# Patient Record
Sex: Female | Born: 1935 | Race: White | Hispanic: No | Marital: Married | Smoking: Former smoker
Health system: Southern US, Community
[De-identification: ages and names within clinical notes are randomized; demographics above are authoritative.]

## PROBLEM LIST (undated history)

## (undated) DIAGNOSIS — I639 Cerebral infarction, unspecified: Secondary | ICD-10-CM

## (undated) DIAGNOSIS — I499 Cardiac arrhythmia, unspecified: Secondary | ICD-10-CM

## (undated) DIAGNOSIS — M1990 Unspecified osteoarthritis, unspecified site: Secondary | ICD-10-CM

## (undated) DIAGNOSIS — I719 Aortic aneurysm of unspecified site, without rupture: Secondary | ICD-10-CM

## (undated) DIAGNOSIS — Z8619 Personal history of other infectious and parasitic diseases: Secondary | ICD-10-CM

## (undated) DIAGNOSIS — J45909 Unspecified asthma, uncomplicated: Secondary | ICD-10-CM

## (undated) DIAGNOSIS — H3530 Unspecified macular degeneration: Secondary | ICD-10-CM

## (undated) DIAGNOSIS — Z9889 Other specified postprocedural states: Secondary | ICD-10-CM

## (undated) DIAGNOSIS — Z9981 Dependence on supplemental oxygen: Secondary | ICD-10-CM

## (undated) DIAGNOSIS — I10 Essential (primary) hypertension: Secondary | ICD-10-CM

## (undated) DIAGNOSIS — R112 Nausea with vomiting, unspecified: Secondary | ICD-10-CM

## (undated) DIAGNOSIS — R0602 Shortness of breath: Secondary | ICD-10-CM

## (undated) DIAGNOSIS — T8859XA Other complications of anesthesia, initial encounter: Secondary | ICD-10-CM

## (undated) DIAGNOSIS — C801 Malignant (primary) neoplasm, unspecified: Secondary | ICD-10-CM

## (undated) DIAGNOSIS — F32A Depression, unspecified: Secondary | ICD-10-CM

## (undated) DIAGNOSIS — J449 Chronic obstructive pulmonary disease, unspecified: Secondary | ICD-10-CM

## (undated) DIAGNOSIS — I4891 Unspecified atrial fibrillation: Secondary | ICD-10-CM

## (undated) DIAGNOSIS — T4145XA Adverse effect of unspecified anesthetic, initial encounter: Secondary | ICD-10-CM

## (undated) DIAGNOSIS — F329 Major depressive disorder, single episode, unspecified: Secondary | ICD-10-CM

## (undated) DIAGNOSIS — R110 Nausea: Secondary | ICD-10-CM

## (undated) HISTORY — PX: CHOLECYSTECTOMY: SHX55

## (undated) HISTORY — PX: ABDOMINAL HYSTERECTOMY: SHX81

## (undated) HISTORY — PX: EYE SURGERY: SHX253

---

## 2009-12-31 ENCOUNTER — Ambulatory Visit: Payer: Self-pay | Admitting: Cardiothoracic Surgery

## 2010-01-03 ENCOUNTER — Ambulatory Visit: Payer: Self-pay | Admitting: Cardiothoracic Surgery

## 2010-03-31 ENCOUNTER — Ambulatory Visit: Payer: Self-pay | Admitting: Cardiothoracic Surgery

## 2010-07-07 ENCOUNTER — Encounter: Admission: RE | Admit: 2010-07-07 | Discharge: 2010-07-07 | Payer: Self-pay | Admitting: Cardiothoracic Surgery

## 2010-07-07 ENCOUNTER — Ambulatory Visit: Payer: Self-pay | Admitting: Cardiothoracic Surgery

## 2010-12-09 ENCOUNTER — Other Ambulatory Visit: Payer: Self-pay | Admitting: Cardiothoracic Surgery

## 2010-12-09 DIAGNOSIS — I7101 Dissection of thoracic aorta: Secondary | ICD-10-CM

## 2011-01-12 ENCOUNTER — Ambulatory Visit
Admission: RE | Admit: 2011-01-12 | Discharge: 2011-01-12 | Disposition: A | Discharge status: Home / Self Care | Payer: Medicare Other | Source: Ambulatory Visit | Attending: Cardiothoracic Surgery | Admitting: Cardiothoracic Surgery

## 2011-01-12 ENCOUNTER — Ambulatory Visit (INDEPENDENT_AMBULATORY_CARE_PROVIDER_SITE_OTHER): Payer: Medicare Other | Admitting: Cardiothoracic Surgery

## 2011-01-12 DIAGNOSIS — I712 Thoracic aortic aneurysm, without rupture: Secondary | ICD-10-CM

## 2011-01-12 DIAGNOSIS — I7101 Dissection of thoracic aorta: Secondary | ICD-10-CM

## 2011-01-12 MED ORDER — IOHEXOL 300 MG/ML  SOLN
100.0000 mL | Freq: Once | INTRAMUSCULAR | Status: AC | PRN
  Administered 2011-01-12: 100 mL via INTRAVENOUS

## 2011-01-13 NOTE — Assessment & Plan Note (Signed)
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  This was in approximately 1 year ago.  Since that time, we have seen her in May, July and November 2011.  She returns at this point with a 38-month follow up of a CTA.  Overall, she feels well and notes that she has had better control of her blood pressure.  Recently, has had no chest or abdominal pain.  This originally was picked up on CT of the abdomen for a right pelvic pain.  She has had no recurrence of this discomfort.  On exam, her blood pressure 132/71, pulse 94, respiratory rate 20 and O2 sats 95%.  Her lungs are clear bilaterally.  She has full and equal pulses bilaterally.  No evidence of embolic phenomenon in the distal extremities.  She has no calf tenderness.  CTA of the chest was performed today and shows a penetrating ulcer in the mid thoracic aorta and also a second area further distal. These areas are about 26 x 23 mm and 11 x 21 mm.  Comparing the scans, there is no difference between the scan done 6 months ago.  I again discussed with her the treatment options of observation and good blood pressure control versus stent grafting of the thoracic aorta.  At this point, she is willing to continue with the current treatment regimen.  I will plan to see her back in 1 year with a CTA of the chest.  Sheliah Plane, MD Electronically Signed  EG/MEDQ  D:  01/12/2011  T:  01/13/2011  Job:  [id number]

## 2011-01-17 NOTE — Assessment & Plan Note (Signed)
[facility] VISIT   Karen Adams, Karen Adams  DOB:  [birth year]                                        Jan 03, 2010  CHART #:  [id number]   HISTORY OF PRESENT ILLNESS:  Karen Adams was seen in the office 3 days ago  after the incidental finding of an aortic ulceration in the distal  descending thoracic aorta.  The scan had been done because of right  lower quadrant pain.  She was seen in the office and the scans were  reviewed last week.  Unfortunately, she never had a scan of the thoracic  aorta in addition, so today she was sent for a CTA of the entire aorta  to evaluate any other abnormal areas.  Since seen last week, she notes  that she is no longer having any pain in the lower right quadrant.  Denies any change in bowel habits, any blood in stool.  She has never  had any evidence of ulceration or nonhealing ulcers or blue toes in the  past.   PHYSICAL EXAMINATION:  GENERAL:  Today she appears well.  VITAL SIGNS:  Her blood pressure is 162/74, heart rate is 72,  respiratory rate is 18.  NEUROLOGICAL:  Patient is alert and neurologically intact.  CHEST:  She does not appear to have any sequelae from the CT scan that  she had earlier today.  HEART:  On exam, her cardiac rate and rhythm was regular.  LUNGS:  Clear bilaterally.  EXTREMITIES:  She has full pedal pulses.  There is no evidence of  peripheral emboli.   HOSPITAL COURSE:  I spent 45 minutes reviewing with the patient the  findings of the CT scan which showed the previously known healed ulcer  with about 8 mm thrombus and a second similar area slightly more  proximal than on the previous scan with a similar appearance.  There  does not appear to be any acute bleed related to either of these ulcers.  The natural history of this atherosclerosis is reviewed with the patient  and her daughter.  She has not smoked for 15 years and has committed to  no further smoking.  She does make attempt to control her blood  pressure  and just recently was started on statin for cholesterol.  I have told  her that these three things are the minimum that we need to do and with  her primary care doctor as not smoking on blood pressure medication and  statin.  At this point,  with the incidental finding of the relatively  small ulcerations and the fact that no definite diagnosis was made as  far as right lower quadrant pain, I am inclined to follow her medically.  I did discuss with her the possibility of stent graft placement.  I have  told her that open surgery would not be indicated at this point.  I plan  to see her back in 3 months in the [place] office, and at that point  decide about scheduling a repeat scan to see if there has been any  changes.  She is aware should she develop any back pain or evidence of  emboli to the lower extremities to contact the office immediately.   Sheliah Plane, MD  Electronically Signed   EG/MEDQ  D:  01/03/2010  T:  01/04/2010  Job:  [id number]   cc:   . Marland Kitchen Dr. Fara Boros

## 2011-01-17 NOTE — Assessment & Plan Note (Signed)
OFFICE VISIT   TACHE, BOBST  DOB:  [birth year]                                        March 31, 2010  CHART #:  [id number]   HISTORY:  The patient returns to the office today in followup after  initial consultation December 31, 2009, and followup visit Jan 13, 2010.  At the time she originally presented because of right lower back pain  and right groin pain, was seen at the [facility], was  treated for urinary tract infection.  A CT scan and an ultrasound were  performed.  Subsequently, a contrasted CT scan of the abdomen and pelvis  was done by GI and showed incidental finding of thoracic aortic  ulcerated healed ulcer and she was sent for evaluation in thoracic  surgery office.  Unfortunately, the initial scan did not look at the  entire thoracic aorta and a followup scan was performed.  There was an 8-  mm ulceration without evidence of bleeding in the more proximal  descending aorta.  The diagnosis was discussed with her and she was told  to stay on her statin, continue with good blood pressure control, and  had a followup appointment here today in the office.  She has no  symptoms.  The right lower quadrant discomfort has completely  disappeared.  She denies any back or chest pain.  She notes that she has  been trying to keep her blood pressure controlled.   PHYSICAL EXAMINATION:  Vital Signs:  Today, in the right arm, her blood  pressure was 176/79, pulse was 64, respiratory rate was 18, and O2 sats  97%.  Neck:  She has no carotid bruits.  Lungs:  Clear bilaterally.  Cardiac:  Regular rate and rhythm without murmur or gallop.  Abdomen:  Benign without tenderness.  Lower extremities:  Without tenderness or  edema.  She has 2+ radial, femoral PT and DP pulses.   CURRENT MEDICATIONS:  1. Advair.  2. Lisinopril 20 mg a day.  3. Ventolin.  4. Simvastatin 40 mg a day.  5. Tramadol.  6. Nifedipine.   IMPRESSION:  Overall, she appears  stable now without any symptoms of  right lower quadrant abdominal pain.  Again, discussed with her the need  for good blood pressure control and I have recommended that we obtain a  followup CT scan, CTA of the thoracic and abdominal aorta in about 3  months, which will be 6 months from her original presentation.  She is  currently not on a beta-blocker, which may be preferable.  However, she  does have lung disease and we will leave this up to the discretion of  Dr. Fara Boros, who is her medical doctor and originally referred her.   Sheliah Plane, MD  Electronically Signed   EG/MEDQ  D:  03/31/2010  T:  03/31/2010  Job:  [id number]   cc:   Dr. Fara Boros

## 2011-01-17 NOTE — Assessment & Plan Note (Signed)
OFFICE VISIT   Karen Adams, Karen Adams  DOB:  Jan 16, 1929                                        July 07, 2010  CHART #:  [id number]   The patient returns to the office today with a followup CT scan.  She  was originally seen in April and May 2011 because of an incidental  finding on abdominal CT of aortic ulcerations in the distal descending  thoracic aorta.  Formal chest and abdominal CT was done.  She returns  today for a followup CT scan.  Since last seen, she turns 77 years old  next week.  She continues to work part-time.  She denies any  claudication.  Denies chest pain.  Denies back pain.  Continues to take  her blood pressure medicine.  She was a previous smoker but stopped many  years ago.  In the last 6 months, she was started on Zocor.   On exam, blood pressure in the right arm was 168/78, she notes that it  usually does not run that high.  O2 sats 93%, heart rate is 90.  Her  lungs are clear.  Cardiac exam reveals regular rate and rhythm.  I do  not palpate any abdominal masses or tenderness.  She has palpable distal  pulses.  No edema.   CT scan is reviewed and compared to CT done on Jan 03, 2010, in [place].  There are two areas of what appeared to be healed penetrating  ulcer measuring 26 x 23 mm and 11 x 21 mm.  These appear stable compared  to the previous exam, have not enlarged.  There is calcification in  origin of the celiac, SMA, and renal arteries, and the vessels are  patent.  She has diffuse atheromatous changes throughout the abdominal  aorta without aneurysm.  There are no suspicious lung nodules noted.  There is no change in low attenuation thyroid nodules noted.   IMPRESSION:  The patient with stable atheromatous changes to the aorta  with healed penetrating ulcers that have not changed in size over 8  months.  I have reviewed with the patient the need to continue with good  blood pressure control and statin therapy.  I have also  discussed with  her the use of stents to cover the thoracic aorta at this point without  any changes on the incidental finding.  We will continue to monitor.  I  plan to see her back in 6 months with a followup CTA of the chest.  She  will continue to keep track of her blood pressure and discuss  this with Dr. Jari Favre who follows her for primary care.  If her blood  pressure remains elevated, consideration of adding a beta-blocker to her  current regimen could be considered.   Sheliah Plane, MD  Electronically Signed   EG/MEDQ  D:  07/07/2010  T:  07/07/2010  Job:  [id number]   cc:   Jari Favre, MD

## 2011-01-17 NOTE — Consult Note (Signed)
[facility]IENT CONSULTATION   Karen Adams, Karen Adams  DOB:  [birth year]                                        December 31, 2009  CHART #:  [id number]   REASON FOR CONSULTATION:  Abnormality of the thoracic aorta on abdominal  CT, question healed penetrating ulcer.   HISTORY OF PRESENT ILLNESS:  The patient is a 77 year old female who  approximately 10 days ago had the onset of right groin and right low  back pain, and was seen in the [facility].  The patient  was told that she had microscopic hematuria and was treated for urinary  tract infection.  She reports that the pain did not improve much and  radiated into the left pelvis  the following day.  She had a CT scan of  the abdomen without contrast and an ultrasound.  The patient reports she  was told she had a small left kidney stone.  Because of the discomfort  and previous history of diverticulosis, she was referred to GI.  A  contrasted CT scan of the abdomen and pelvis was done that showed  incidental finding of thoracic aorta with a question of ulcerated healed  plaque and she was referred for  thoracic surgery consultation.  She  notes that over the past several days, the discomfort has improved.  She  denies any change in bowel habits.  No blood in her stool or urine.  Denies any pain with urination.  She denies any upper back pain.  She is  a previous smoker, but quit more than 10 years ago.  She denies  claudication.   PAST MEDICAL HISTORY:  1. Significant for hypertension.  2. Recently started on treatment for hypercholesterolemia.  3. History of multinodular goiter, needle biopsy was done 6 months      ago.   PAST SURGICAL HISTORY:  1. Appendectomy.  2. Cholecystectomy, laparoscopically 2006.  3. Colonoscopy 10 years ago 2001.  4. Hysterectomy in 1963 for fibroids.   FAMILY/SOCIAL HISTORY:  The patient does have family history of coronary  artery disease.  Notes that 2 siblings have  stents in their coronary  arteries.  She denies alcohol use.  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  Quit  smoking 12 years ago.   CURRENT MEDICATIONS:  1. Advair Diskus.  2. Lisinopril 20 mg a day.  3. Ventolin.  4. Simvastatin 40 mg a day.  5. Nitrofurantoin.  6. Tramadol.  7. Nifedipine.   ALLERGIES:  1. LATEX causes rash.  2. She had a rash many years ago with SHELLFISH and does not remember      the details of this.   PHYSICAL EXAMINATION:  VITAL SIGNS:  Blood pressure is 158/62, pulse is  70 and regular.  NECK:  She has no carotid bruits.  LUNGS:  I do not appreciate any palpable nodule in her thyroid, though  she has reported that this been felt before.  LUNGS:  Clear bilaterally.  CARDIAC:  Reveals a regular rate and rhythm without murmur or gallop.  She has no wheezing.  ABDOMEN:  Positive bowel sounds and without tenderness.  She notes that  the pain originally started in the right lower quadrant and right groin  area.  On palpation in this area, I  do not appreciate any hernias, but  without much tenderness.  She has no CVA tenderness.  LOWER EXTREMITIES:  DP and PT pulses are 2+ bilaterally.   The patient's CT scan of the abdomen and pelvis with contrast done on  December 29, 2009 is reviewed from the disk that the patient brought.  She  has extensive atherosclerotic changes throughout the aortoiliac vessels.  She has evidence of diverticulosis.  On the left side posterolaterally  extending about 1 cm, is an area that appears to be a healed penetrating  ulcer of the aorta.  The remainder of the aorta is not dilated.  The  remainder of the abdominal aorta as noted shows extensive  atherosclerotic changes.  Her femoral and iliac vessels are relatively  small and calcified.  Unfortunately, the area of the abnormalities in  the lower thoracic aorta, but the full extent is not appreciated because  the scan was dedicated  to the abdomen and thoracic aorta was not  evaluated.   IMPRESSION:  Patient with unexplained, though improving right lower  quadrant and right pelvic pain with known diverticulosis and an  incidental finding of probable healed penetrating ulcer of the aorta.  At this point, it does not appear to be any acute bleed in the area and  I suspect that the symptoms the patient is having is unrelated to the  aortic finding.  Prior to making any formal recommendation as to a  treatment/plan, I have asked the patient to have a CT scan of the chest  to fully evaluate the entire aorta and know what the proximal extent of  aortic disease is.  Possible considerations would be to place a stent  graft in the thoracic aorta.  However, the patient with her extensive  atherosclerotic disease in the pelvis with relatively small iliac  vessels, this would not be an undertaking without some risks.  After we  see the CT of the chest, then we will further discuss with her treatment  options.  Minimal would be good blood pressure control, avoiding smoking  and statin therapy as she is currently undergoing.  I have discussed  with her and her daughters that it is unlikely that the symptoms she is  having in the right pelvic area are related to this abnormality.  After  the CT scan of the chest, we can discuss with GI continuing with their  evaluation of her colon and question of diverticulosis/diverticulitis.  Currently the patient by exam does not have acute diverticulitis.  We  will contact Dr. Joseph Art' office for pretreatment because of her history  of shellfish allergies.  A previous contrast scan done earlier this  week, she did not have any difficulty with findings.   Sheliah Plane, MD  Electronically Signed   EG/MEDQ  D:  12/31/2009  T:  12/31/2009  Job:  [id number]   cc:   Fara Boros, MD

## 2011-08-23 ENCOUNTER — Encounter: Payer: Self-pay | Admitting: *Deleted

## 2011-12-14 ENCOUNTER — Other Ambulatory Visit: Payer: Self-pay | Admitting: Cardiothoracic Surgery

## 2011-12-14 DIAGNOSIS — I712 Thoracic aortic aneurysm, without rupture: Secondary | ICD-10-CM

## 2012-01-18 ENCOUNTER — Ambulatory Visit
Admission: RE | Admit: 2012-01-18 | Discharge: 2012-01-18 | Disposition: A | Discharge status: Home / Self Care | Payer: Medicare Other | Source: Ambulatory Visit | Attending: Cardiothoracic Surgery | Admitting: Cardiothoracic Surgery

## 2012-01-18 ENCOUNTER — Ambulatory Visit (INDEPENDENT_AMBULATORY_CARE_PROVIDER_SITE_OTHER): Payer: Medicare Other | Admitting: Cardiothoracic Surgery

## 2012-01-18 ENCOUNTER — Encounter: Payer: Self-pay | Admitting: Cardiothoracic Surgery

## 2012-01-18 VITALS — BP 130/78 | HR 85 | Resp 16 | Ht 60.0 in | Wt 120.0 lb

## 2012-01-18 DIAGNOSIS — I712 Thoracic aortic aneurysm, without rupture: Secondary | ICD-10-CM

## 2012-01-18 MED ORDER — IOHEXOL 300 MG/ML  SOLN
100.0000 mL | Freq: Once | INTRAMUSCULAR | Status: AC | PRN
  Administered 2012-01-18: 100 mL via INTRAVENOUS

## 2012-01-18 NOTE — Progress Notes (Signed)
[address]            [place] [address]          [phone]      Karen Adams [facility] Medical Record #[id number] Date of Birth: [birth year]  Referring: Sondra Come, MD Primary Care: No primary provider on file.  Chief Complaint:    Chief Complaint  Patient presents with  . Follow-up    ulcerated plaques of the descending thoracic aorta...1 year with CTA CHEST    History of Present Illness:    Patient returns to the office today in followup of her known abnormal descending aorta 3 years ago she was incidentally found to have atherosclerotic ulceration in her descending thoracic aorta has been followed since that time intermittently with CT scan. Since last seen she's had no change in her health status continues to work. She denies chest pain hasn't had no change in bowel habits has had no ulceration of her or nonhealing ulcers of her extremities.      Current Activity/ Functional Status: Patient is independent with mobility/ambulation, transfers, ADL's, IADL's.   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 Former Smoker  . Quit date: 01/18/1984  Smokeless tobacco  . Never Used    History  Alcohol Use No     Allergies  Allergen Reactions  . Latex Rash    Current Outpatient Prescriptions  Medication Sig Dispense Refill  . atorvastatin (LIPITOR) 10 MG tablet Take 10 mg by mouth daily.      . folic acid (FOLVITE) 1 MG tablet Take 1 mg by mouth daily.      Marland Kitchen gabapentin (NEURONTIN) 100 MG capsule Take  100 mg by mouth 1 day or 1 dose. ONE TO THREE A DAY      . lisinopril (PRINIVIL,ZESTRIL) 10 MG tablet Take 10 mg by mouth daily.      . methotrexate (RHEUMATREX) 2.5 MG tablet Take 7.5 mg by mouth 3 (three) times a week. ONE TO SEVEN TABS A WEEK      . NIFEdipine (PROCARDIA XL/ADALAT-CC) 30 MG 24 hr tablet Take 30 mg by mouth daily.       No current facility-administered medications for this visit.   Facility-Administered Medications Ordered in Other Visits  Medication Dose Route Frequency Provider Last Rate Last Dose  . iohexol (OMNIPAQUE) 300 MG/ML solution 100 mL  100 mL Intravenous Once PRN Medication Radiologist, MD   100 mL at 01/18/12 1234       Review of Systems:     Cardiac Review of Systems: Y or N  Chest Pain [ n  ]  Resting SOB [ n  ] Exertional SOB  [ n ]  Orthopnea [n  ]   Pedal Edema [ n  ]    Palpitations [ n ] Syncope  [ n ]   Presyncope [ n  ]  General Review of Systems: [Y] = yes [  ]=no Constitional: recent weight change [  ]; anorexia [  ]; fatigue [  ];  nausea [  ]; night sweats [  ]; fever [  ]; or chills [  ];                                                                                                                                          Dental: poor dentition[ n]; Last Dentist visit:   Eye : blurred vision [  ]; diplopia [   ]; vision changes [  ];  Amaurosis fugax[  ]; Resp: cough [  ];  wheezing[ rare];  hemoptysis[  ]; shortness of breath[ n ]; paroxysmal nocturnal dyspnea[n  ]; dyspnea on exertion[  mild]; or orthopnea[  ];  GI:  gallstones[  ], vomiting[  ];  dysphagia[  ]; melena[  ];  hematochezia [  ]; heartburn[  ];   Hx of  Colonoscopy[  ]; GU: kidney stones [  ]; hematuria[  ];   dysuria [  ];  nocturia[  ];  history of     obstruction [  ];             Skin: rash, swelling[  ];, hair loss[  ];  peripheral edema[n  ];  or itching[  ]; Musculosketetal: myalgias[ y ];  joint swelling[  ];  joint erythema[  ];  joint pain[  ];  back pain[   ];  Heme/Lymph: bruising[  ];  bleeding[  ];  anemia[  ];  Neuro: TIA[  ];  headaches[  ];  stroke[  ];  vertigo[  ];  seizures[  ];   paresthesias[  ];  difficulty walking[ n ];  Psych:depression[  ]; anxiety[  ];  Endocrine: diabetes[  ];  thyroid dysfunction[  ];  Immunizations: Flu [  ]; Pneumococcal[  ];  Other:  Physical Exam: BP 130/78  Pulse 85  Resp 16  Ht 5' (1.524 m)  Wt 120 lb (54.432 kg)  BMI 23.44 kg/m2  SpO2 93%  General appearance: alert, cooperative, appears stated age and no distress Neurologic: intact Heart: regular rate and rhythm, S1, S2 normal, no murmur, click, rub or gallop and normal apical impulse Lungs: clear to auscultation bilaterally and normal percussion bilaterally Abdomen: soft, non-tender; bowel sounds normal; no masses,  no organomegaly Extremities: extremities normal, atraumatic, no cyanosis or edema, Homans sign is negative, no sign of DVT and full pedal pulses, no ischemic changes in feet no carotid bruits   Diagnostic Studies & Laboratory data:     Recent Radiology Findings:   Ct Angio Chest W/cm &/or Wo Cm  01/18/2012  *RADIOLOGY REPORT*  Clinical Data: Follow up of thoracic aortic aneurysm, former smoking history, occasional shortness of breath  CT ANGIOGRAPHY CHEST  Technique:  Multidetector CT imaging of the chest using the standard protocol during bolus administration of intravenous contrast. Multiplanar reconstructed images including MIPs were obtained and reviewed to evaluate the vascular anatomy.  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  In addition the small possibly healed penetrating ulcer of the more distal thoracic aorta is stable measuring 1.6 x 0.9 cm compared to 2.2 x 1.0 cm previously.  A small adjacent penetrating ulcer is stable as well.   Within the more proximal thoracic aorta there is a tiny penetrating ulcer present which appears stable.  There are diffuse atheromatous changes throughout the aortic arch and descending thoracic aorta.  The ascending aorta measures 3.0 cm in maximum diameter.  The pulmonary arteries opacify with no significant abnormality noted.  Coronary artery calcifications are seen primarily in the distribution of the left anterior descending artery.  No mediastinal or hilar adenopathy is seen. The somewhat low attenuation inhomogeneous right thyroid nodule is stable at 2.0 x 1.7 cm.  On the lung window images, diffuse changes of centrilobular emphysema are present.  No suspicious lung nodule is seen.  No infiltrate or effusion is noted.  No bony abnormality is seen.  IMPRESSION:  1.  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 2.  Stable distal descending thoracic aortic protrusion with thrombus most likely a healed penetrating ulcer. 3.  Stable small penetrating ulcer within the more proximal thoracic aorta. 4.  Stable right thyroid nodule. 5. Diffuse centrilobular emphysema.  Original Report Authenticated By: Juline Patch, M.D.      Recent Lab Findings: No results found for this basename: WBC, HGB, HCT, PLT, GLUCOSE, CHOL, TRIG, HDL, LDLDIRECT, LDLCALC, ALT, AST, NA, K, CL, CREATININE, BUN, CO2, TSH, INR, GLUF, HGBA1C      Assessment / Plan:     The patient's atherosclerotic ulcerations of the descending thoracic aorta appear stable on the CT scan today compared to one year ago, in fact appeared to have healed to some degree in her smaller. At this point been stable would not recommend any interventional procedure, though if we see change in the future thoracic stent graft could be used. I'll plan to see her back in one year with a followup CTA of the aorta.       Delight Ovens MD  Beeper [phone] Office [phone] 01/18/2012 3:14 PM

## 2012-01-25 ENCOUNTER — Other Ambulatory Visit: Payer: Medicare Other

## 2012-01-25 ENCOUNTER — Ambulatory Visit: Payer: Medicare Other | Admitting: Cardiothoracic Surgery

## 2013-11-19 ENCOUNTER — Emergency Department (HOSPITAL_BASED_OUTPATIENT_CLINIC_OR_DEPARTMENT_OTHER): Payer: Medicare Other

## 2013-11-19 ENCOUNTER — Observation Stay (HOSPITAL_BASED_OUTPATIENT_CLINIC_OR_DEPARTMENT_OTHER)
Admission: EM | Admit: 2013-11-19 | Discharge: 2013-11-20 | Disposition: A | Discharge status: Home / Self Care | Payer: Medicare Other | Attending: Internal Medicine | Admitting: Internal Medicine

## 2013-11-19 ENCOUNTER — Encounter (HOSPITAL_BASED_OUTPATIENT_CLINIC_OR_DEPARTMENT_OTHER): Payer: Self-pay | Admitting: Emergency Medicine

## 2013-11-19 DIAGNOSIS — Z9089 Acquired absence of other organs: Secondary | ICD-10-CM | POA: Insufficient documentation

## 2013-11-19 DIAGNOSIS — R0602 Shortness of breath: Secondary | ICD-10-CM | POA: Diagnosis present

## 2013-11-19 DIAGNOSIS — R918 Other nonspecific abnormal finding of lung field: Secondary | ICD-10-CM

## 2013-11-19 DIAGNOSIS — Z8673 Personal history of transient ischemic attack (TIA), and cerebral infarction without residual deficits: Secondary | ICD-10-CM | POA: Insufficient documentation

## 2013-11-19 DIAGNOSIS — R599 Enlarged lymph nodes, unspecified: Secondary | ICD-10-CM | POA: Insufficient documentation

## 2013-11-19 DIAGNOSIS — I4891 Unspecified atrial fibrillation: Secondary | ICD-10-CM | POA: Diagnosis present

## 2013-11-19 DIAGNOSIS — J449 Chronic obstructive pulmonary disease, unspecified: Secondary | ICD-10-CM | POA: Insufficient documentation

## 2013-11-19 DIAGNOSIS — M545 Low back pain, unspecified: Secondary | ICD-10-CM | POA: Insufficient documentation

## 2013-11-19 DIAGNOSIS — M899 Disorder of bone, unspecified: Secondary | ICD-10-CM | POA: Insufficient documentation

## 2013-11-19 DIAGNOSIS — Z90710 Acquired absence of both cervix and uterus: Secondary | ICD-10-CM | POA: Insufficient documentation

## 2013-11-19 DIAGNOSIS — C3490 Malignant neoplasm of unspecified part of unspecified bronchus or lung: Principal | ICD-10-CM | POA: Diagnosis present

## 2013-11-19 DIAGNOSIS — M949 Disorder of cartilage, unspecified: Secondary | ICD-10-CM

## 2013-11-19 DIAGNOSIS — C7951 Secondary malignant neoplasm of bone: Secondary | ICD-10-CM

## 2013-11-19 DIAGNOSIS — Z87891 Personal history of nicotine dependence: Secondary | ICD-10-CM | POA: Insufficient documentation

## 2013-11-19 DIAGNOSIS — Z79899 Other long term (current) drug therapy: Secondary | ICD-10-CM | POA: Insufficient documentation

## 2013-11-19 DIAGNOSIS — Z7901 Long term (current) use of anticoagulants: Secondary | ICD-10-CM | POA: Insufficient documentation

## 2013-11-19 DIAGNOSIS — J4489 Other specified chronic obstructive pulmonary disease: Secondary | ICD-10-CM | POA: Insufficient documentation

## 2013-11-19 HISTORY — DX: Unspecified atrial fibrillation: I48.91

## 2013-11-19 HISTORY — DX: Chronic obstructive pulmonary disease, unspecified: J44.9

## 2013-11-19 HISTORY — DX: Cerebral infarction, unspecified: I63.9

## 2013-11-19 LAB — BASIC METABOLIC PANEL
BUN: 20 mg/dL (ref 6–23)
CALCIUM: 9.3 mg/dL (ref 8.4–10.5)
CO2: 31 meq/L (ref 19–32)
CREATININE: 0.8 mg/dL (ref 0.50–1.10)
Chloride: 103 mEq/L (ref 96–112)
GFR calc non Af Amer: 67 mL/min — ABNORMAL LOW (ref >90)
GFR, EST AFRICAN AMERICAN: 77 mL/min — AB (ref >90)
Glucose, Bld: 101 mg/dL — ABNORMAL HIGH (ref 70–99)
Potassium: 4.7 mEq/L (ref 3.7–5.3)
Sodium: 143 mEq/L (ref 137–147)

## 2013-11-19 LAB — CBC WITH DIFFERENTIAL/PLATELET
BASOS ABS: 0 10*3/uL (ref 0.0–0.1)
BASOS PCT: 0 % (ref 0–1)
EOS PCT: 8 % — AB (ref 0–5)
Eosinophils Absolute: 0.8 10*3/uL — ABNORMAL HIGH (ref 0.0–0.7)
HEMATOCRIT: 39.3 % (ref 36.0–46.0)
Hemoglobin: 12.5 g/dL (ref 12.0–15.0)
Lymphocytes Relative: 17 % (ref 12–46)
Lymphs Abs: 1.6 10*3/uL (ref 0.7–4.0)
MCH: 30.1 pg (ref 26.0–34.0)
MCHC: 31.8 g/dL (ref 30.0–36.0)
MCV: 94.7 fL (ref 78.0–100.0)
MONO ABS: 1.1 10*3/uL — AB (ref 0.1–1.0)
Monocytes Relative: 11 % (ref 3–12)
Neutro Abs: 6 10*3/uL (ref 1.7–7.7)
Neutrophils Relative %: 64 % (ref 43–77)
Platelets: 335 10*3/uL (ref 150–400)
RBC: 4.15 MIL/uL (ref 3.87–5.11)
RDW: 15.7 % — AB (ref 11.5–15.5)
WBC: 9.4 10*3/uL (ref 4.0–10.5)

## 2013-11-19 LAB — TROPONIN I

## 2013-11-19 MED ORDER — ALBUTEROL SULFATE (2.5 MG/3ML) 0.083% IN NEBU
2.5000 mg | INHALATION_SOLUTION | Freq: Once | RESPIRATORY_TRACT | Status: AC
  Administered 2013-11-19: 2.5 mg via RESPIRATORY_TRACT

## 2013-11-19 MED ORDER — ALBUTEROL SULFATE (2.5 MG/3ML) 0.083% IN NEBU
INHALATION_SOLUTION | RESPIRATORY_TRACT | Status: AC
  Filled 2013-11-19: qty 3

## 2013-11-19 NOTE — ED Notes (Signed)
Pt c/o rt lower back pain x4days denies injury; pt c/o SOB with wheezing that started today, states has PRN O2 and used it but still SOB on exertion. Pt in no distress at this time

## 2013-11-19 NOTE — ED Notes (Signed)
Pt c/o lower back pain x 4 days  Getting worse   Denies inj,

## 2013-11-19 NOTE — ED Notes (Signed)
Bi lateral breath sounds clear and diminished, slight expiratory wheeze left lower lobe. Pt is in no distress at this time.

## 2013-11-19 NOTE — ED Provider Notes (Addendum)
CSN: [id number]     Arrival date & time 11/19/13  2233 History  This chart was scribed for Cashe Gatt Alfonso Patten, MD by Sydell Axon, ED Scribe. This patient was seen in room MH05/MH05 and the patient's care was started at 11:28 PM.  Chief Complaint  Patient presents with  . Back Pain  . Shortness of Breath   Patient is a 80 y.o. female presenting with shortness of breath. The history is provided by the patient and a caregiver. No language interpreter was used.  Shortness of Breath Severity:  Moderate Onset quality:  Gradual Timing:  Constant Progression:  Worsening Chronicity:  Chronic Context: not URI   Relieved by:  Nothing Worsened by:  Nothing tried Ineffective treatments:  None tried Associated symptoms: wheezing   Associated symptoms: no abdominal pain, no chest pain, no cough, no diaphoresis, no fever, no hemoptysis, no sore throat, no sputum production and no vomiting   Risk factors: no recent surgery    HPI Comments: Karen Adams is a 80 y.o. female who presents to the Emergency Department with a chief complaint of atraumatic R lower back pain with onset 4 days ago. Patient's caregiver reports that the pain has been constant and progressively worsening over the course of four days. Patient's caregiver states that she has a history of asthma and COPD and has had to stay in the hospital recently for breathing difficulty. Since, returning, patient has been having diffiiculty and wheezing. Patient's caregiver states patient has PRN O2; however, continued to notice patient SOB. Patient denies any coughing. Patient's caregiver states patient has taken tylenol with no relief and her usual albuterol breathing treatment, with moderate relief.   Past Medical History  Diagnosis Date  . COPD (chronic obstructive pulmonary disease)   . Stroke    Past Surgical History  Procedure Laterality Date  . Cholecystectomy    . Abdominal hysterectomy     No family history on file. History   Substance Use Topics  . Smoking status: Former Smoker    Quit date: 01/18/1984  . Smokeless tobacco: Never Used  . Alcohol Use: No   OB History   Grav Para Term Preterm Abortions TAB SAB Ect Mult Living                 Review of Systems  Constitutional: Negative for fever, chills and diaphoresis.  HENT: Negative for congestion and sore throat.   Respiratory: Positive for shortness of breath and wheezing. Negative for cough, hemoptysis and sputum production.   Cardiovascular: Negative for chest pain.  Gastrointestinal: Negative for nausea, vomiting, abdominal pain, diarrhea and constipation.  Musculoskeletal: Positive for back pain (R lower).  Neurological: Negative for weakness.  All other systems reviewed and are negative.   Allergies  Latex  Home Medications   Current Outpatient Rx  Name  Route  Sig  Dispense  Refill  . atorvastatin (LIPITOR) 10 MG tablet   Oral   Take 10 mg by mouth daily.         Marland Kitchen donepezil (ARICEPT) 10 MG tablet   Oral   Take 10 mg by mouth at bedtime.         . folic acid (FOLVITE) 1 MG tablet   Oral   Take 1 mg by mouth daily.         Marland Kitchen gabapentin (NEURONTIN) 100 MG capsule   Oral   Take 100 mg by mouth 1 day or 1 dose. ONE TO THREE A DAY         .  lisinopril (PRINIVIL,ZESTRIL) 10 MG tablet   Oral   Take 10 mg by mouth daily.         Marland Kitchen LORazepam (ATIVAN) 0.5 MG tablet   Oral   Take 0.5 mg by mouth every 8 (eight) hours.         . methotrexate (RHEUMATREX) 2.5 MG tablet   Oral   Take 7.5 mg by mouth 3 (three) times a week. ONE TO SEVEN TABS A WEEK         . NIFEdipine (PROCARDIA XL/ADALAT-CC) 30 MG 24 hr tablet   Oral   Take 30 mg by mouth daily.         . sertraline (ZOLOFT) 100 MG tablet   Oral   Take 100 mg by mouth daily.          Triage Vitals: BP 140/76  Pulse 88  Temp(Src) 97.6 F (36.4 C) (Oral)  Resp 18  Ht 5' (1.524 m)  Wt 112 lb (50.803 kg)  BMI 21.87 kg/m2  SpO2 98%  Physical Exam   Nursing note and vitals reviewed. Constitutional: She appears well-developed and well-nourished. No distress.  HENT:  Head: Normocephalic and atraumatic.  Mouth/Throat: Oropharynx is clear and moist. No oropharyngeal exudate.  Eyes: EOM are normal. Pupils are equal, round, and reactive to light.  Neck: Normal range of motion. Neck supple. No tracheal deviation present.  Cardiovascular: Normal rate, regular rhythm and intact distal pulses.   Pulmonary/Chest: Effort normal. No respiratory distress. She has no wheezes. She has no rales.  Diminished bilaterally.  Abdominal: Soft. Bowel sounds are normal. There is no tenderness. There is no rebound and no guarding.  Musculoskeletal: Normal range of motion. She exhibits no edema and no tenderness.  No stepoffs. No crepitus, no point tendernes over L spine. Pain is in pelvis.  Intact L5/s1 intact perineal sensation  Neurological: She is alert. She has normal reflexes.  Intact DTRs.  Skin: Skin is warm and dry.  Psychiatric: She has a normal mood and affect. Her behavior is normal.   ED Course  Procedures (including critical care time)  DIAGNOSTIC STUDIES: Oxygen Saturation is 98% on room air, normal by my interpretation.    COORDINATION OF CARE: 11:35 PM-Ordered Albuterol nebulizer breathing treatment, CXR and L-Spine xray. Will follow up with patient following imaging results. Treatment plan discussed with patient and patient agrees.  Labs Review Labs Reviewed  CBC WITH DIFFERENTIAL - Abnormal; Notable for the following:    RDW 15.7 (*)    Monocytes Absolute 1.1 (*)    Eosinophils Relative 8 (*)    Eosinophils Absolute 0.8 (*)    All other components within normal limits  BASIC METABOLIC PANEL  TROPONIN I   Imaging Review No results found.   EKG Interpretation None      MDM   Final diagnoses:  None     Date: 11/20/2013  Rate: 75  Rhythm: normal sinus rhythm  QRS Axis: normal  Intervals: normal  ST/T Wave  abnormalities: normal  Conduction Disutrbances:none  Narrative Interpretation: PACS  Old EKG Reviewed: none available  MDM Reviewed: nursing note, vitals and previous chart Reviewed previous: CT scan Interpretation: labs (normal creatinine, see EKG CT scan mass present) Total time providing critical care: 30-74 minutes. This excludes time spent performing separately reportable procedures and services. Consults: admitting MD   Medications  morphine 2 MG/ML injection 2 mg (not administered)  albuterol (PROVENTIL) (2.5 MG/3ML) 0.083% nebulizer solution 2.5 mg (2.5 mg Nebulization Given 11/19/13 2336)  iohexol (  OMNIPAQUE) 350 MG/ML injection 80 mL (80 mLs Intravenous Contrast Given 11/20/13 0120)    I personally performed the services described in this documentation, which was scribed in my presence. The recorded information has been reviewed and is accurate.  Case d/w Dr. Earnest Conroy of [facility] bronchoscopy can be done at either facility  Nayali Talerico K Elia Keenum-Rasch, MD 11/20/13 3435  Carlisle Beers, MD 11/20/13 [phone]

## 2013-11-20 ENCOUNTER — Other Ambulatory Visit: Payer: Self-pay | Admitting: Pulmonary Disease

## 2013-11-20 ENCOUNTER — Encounter (HOSPITAL_BASED_OUTPATIENT_CLINIC_OR_DEPARTMENT_OTHER): Payer: Self-pay | Admitting: Neurology

## 2013-11-20 ENCOUNTER — Emergency Department (HOSPITAL_BASED_OUTPATIENT_CLINIC_OR_DEPARTMENT_OTHER): Payer: Medicare Other

## 2013-11-20 DIAGNOSIS — I4891 Unspecified atrial fibrillation: Secondary | ICD-10-CM | POA: Diagnosis present

## 2013-11-20 DIAGNOSIS — C3490 Malignant neoplasm of unspecified part of unspecified bronchus or lung: Secondary | ICD-10-CM | POA: Diagnosis present

## 2013-11-20 DIAGNOSIS — C7951 Secondary malignant neoplasm of bone: Secondary | ICD-10-CM

## 2013-11-20 DIAGNOSIS — J449 Chronic obstructive pulmonary disease, unspecified: Secondary | ICD-10-CM

## 2013-11-20 DIAGNOSIS — R918 Other nonspecific abnormal finding of lung field: Secondary | ICD-10-CM

## 2013-11-20 DIAGNOSIS — R222 Localized swelling, mass and lump, trunk: Secondary | ICD-10-CM

## 2013-11-20 DIAGNOSIS — R0602 Shortness of breath: Secondary | ICD-10-CM

## 2013-11-20 DIAGNOSIS — C7952 Secondary malignant neoplasm of bone marrow: Secondary | ICD-10-CM

## 2013-11-20 LAB — PROTIME-INR
INR: 2.05 — ABNORMAL HIGH (ref 0.00–1.49)
Prothrombin Time: 22.5 seconds — ABNORMAL HIGH (ref 11.6–15.2)

## 2013-11-20 MED ORDER — METHOTREXATE 2.5 MG PO TABS: 17.5000 mg | ORAL_TABLET | ORAL | Status: DC

## 2013-11-20 MED ORDER — GABAPENTIN 100 MG PO CAPS
100.0000 mg | ORAL_CAPSULE | Freq: Every day | ORAL | Status: DC
  Administered 2013-11-20: 100 mg via ORAL
  Filled 2013-11-20: qty 1

## 2013-11-20 MED ORDER — OXYCODONE-ACETAMINOPHEN 5-325 MG PO TABS: 2.0000 | ORAL_TABLET | ORAL | Status: DC | PRN

## 2013-11-20 MED ORDER — DONEPEZIL HCL 10 MG PO TABS: 10.0000 mg | ORAL_TABLET | Freq: Every day | ORAL | Status: AC

## 2013-11-20 MED ORDER — HEPARIN (PORCINE) IN NACL 100-0.45 UNIT/ML-% IJ SOLN
750.0000 [IU]/h | INTRAMUSCULAR | Status: DC
  Administered 2013-11-20: 750 [IU]/h via INTRAVENOUS
  Filled 2013-11-20: qty 250

## 2013-11-20 MED ORDER — GABAPENTIN 100 MG PO CAPS
100.0000 mg | ORAL_CAPSULE | ORAL | Status: DC
  Filled 2013-11-20 (×2): qty 1

## 2013-11-20 MED ORDER — FOLIC ACID 1 MG PO TABS
1.0000 mg | ORAL_TABLET | Freq: Every day | ORAL | Status: DC
  Administered 2013-11-20: 1 mg via ORAL
  Filled 2013-11-20: qty 1

## 2013-11-20 MED ORDER — BOOST PLUS PO LIQD: 237.0000 mL | Freq: Two times a day (BID) | ORAL | Status: DC

## 2013-11-20 MED ORDER — ALBUTEROL SULFATE (2.5 MG/3ML) 0.083% IN NEBU: 2.5000 mg | INHALATION_SOLUTION | RESPIRATORY_TRACT | Status: DC | PRN

## 2013-11-20 MED ORDER — ATORVASTATIN CALCIUM 10 MG PO TABS
10.0000 mg | ORAL_TABLET | Freq: Every day | ORAL | Status: AC
Start: 2013-11-20 — End: ?

## 2013-11-20 MED ORDER — ATORVASTATIN CALCIUM 10 MG PO TABS
10.0000 mg | ORAL_TABLET | Freq: Every day | ORAL | Status: DC
  Administered 2013-11-20: 10 mg via ORAL
  Filled 2013-11-20 (×2): qty 1

## 2013-11-20 MED ORDER — DONEPEZIL HCL 10 MG PO TABS
10.0000 mg | ORAL_TABLET | Freq: Every day | ORAL | Status: DC
  Filled 2013-11-20: qty 1

## 2013-11-20 MED ORDER — MORPHINE SULFATE 2 MG/ML IJ SOLN
2.0000 mg | Freq: Once | INTRAMUSCULAR | Status: AC
  Administered 2013-11-20: 2 mg via INTRAVENOUS
  Filled 2013-11-20: qty 1

## 2013-11-20 MED ORDER — LISINOPRIL 10 MG PO TABS: 10.0000 mg | ORAL_TABLET | Freq: Every day | ORAL | Status: DC

## 2013-11-20 MED ORDER — NIFEDIPINE ER 30 MG PO TB24: 30.0000 mg | ORAL_TABLET | Freq: Every day | ORAL | Status: AC

## 2013-11-20 MED ORDER — SODIUM CHLORIDE 0.9 % IV SOLN: INTRAVENOUS | Status: DC

## 2013-11-20 MED ORDER — SERTRALINE HCL 100 MG PO TABS
100.0000 mg | ORAL_TABLET | Freq: Every day | ORAL | Status: DC
  Administered 2013-11-20: 100 mg via ORAL
  Filled 2013-11-20 (×2): qty 1

## 2013-11-20 MED ORDER — IOHEXOL 350 MG/ML SOLN
80.0000 mL | Freq: Once | INTRAVENOUS | Status: AC | PRN
  Administered 2013-11-20: 80 mL via INTRAVENOUS

## 2013-11-20 MED ORDER — LISINOPRIL 10 MG PO TABS
10.0000 mg | ORAL_TABLET | Freq: Every day | ORAL | Status: DC
  Administered 2013-11-20: 10 mg via ORAL
  Filled 2013-11-20: qty 1

## 2013-11-20 MED ORDER — FOLIC ACID 1 MG PO TABS: 1.0000 mg | ORAL_TABLET | Freq: Every day | ORAL | Status: AC

## 2013-11-20 MED ORDER — ALBUTEROL SULFATE HFA 108 (90 BASE) MCG/ACT IN AERS: 2.0000 | INHALATION_SPRAY | Freq: Four times a day (QID) | RESPIRATORY_TRACT | Status: DC | PRN

## 2013-11-20 MED ORDER — OXYCODONE-ACETAMINOPHEN 5-325 MG PO TABS
2.0000 | ORAL_TABLET | ORAL | Status: DC | PRN
  Administered 2013-11-20: 2 via ORAL
  Filled 2013-11-20: qty 2

## 2013-11-20 MED ORDER — GABAPENTIN 600 MG PO TABS: 600.0000 mg | ORAL_TABLET | Freq: Every day | ORAL | Status: DC

## 2013-11-20 MED ORDER — NIFEDIPINE ER 30 MG PO TB24
30.0000 mg | ORAL_TABLET | Freq: Every day | ORAL | Status: DC
  Administered 2013-11-20: 30 mg via ORAL
  Filled 2013-11-20: qty 1

## 2013-11-20 MED ORDER — METOPROLOL TARTRATE 25 MG PO TABS: 12.5000 mg | ORAL_TABLET | Freq: Two times a day (BID) | ORAL | Status: AC

## 2013-11-20 MED ORDER — LORAZEPAM 0.5 MG PO TABS
0.5000 mg | ORAL_TABLET | Freq: Three times a day (TID) | ORAL | Status: DC
  Administered 2013-11-20 (×2): 0.5 mg via ORAL
  Filled 2013-11-20 (×2): qty 1

## 2013-11-20 MED ORDER — SERTRALINE HCL 100 MG PO TABS: 100.0000 mg | ORAL_TABLET | Freq: Every day | ORAL | Status: AC

## 2013-11-20 MED ORDER — PHYTONADIONE 5 MG PO TABS
5.0000 mg | ORAL_TABLET | Freq: Once | ORAL | Status: AC
  Administered 2013-11-20: 5 mg via ORAL
  Filled 2013-11-20: qty 1

## 2013-11-20 NOTE — Progress Notes (Signed)
INITIAL NUTRITION ASSESSMENT  Pt meets criteria for NON-SEVERE (MODERATE) malnutrition in the context of chronic illness as evidenced by moderate fat and muscle mass loss.  DOCUMENTATION CODES Per approved criteria  -Non-severe (moderate) malnutrition in the context of chronic illness   INTERVENTION: Provide Boost Plus BID between meals, each container provides 360 kcals and 14 grams of protein.  NUTRITION DIAGNOSIS: Increased nutrient needs related to chronic illness, cancer as evidenced by estimated nutrition needs.   Goal: Pt to meet >/ = 90% of their estimated nutrition needs.  Monitor:  PO intake, weight trends, labs, I/O's  Reason for Assessment: MST  80 y.o. female  Admitting Dx: Lung cancer  ASSESSMENT: 80 y.o. female who presents to the ED with back ache and SOB. Her R lower back pain onset 4 days ago, constant and progressively worsening over the course of the past 4 days. Her SOB has been worsening progressively since January of this year. In the ED today, repeat imaging was performed which shows marked growth of the mass, spread to lymphnodes, and an apparent T2 spinal met. Patient has been transferred to [facility] for further work up on what is now clearly a malignant neoplastic process.  Pt reports eating "ok" at home. Daughter explains she has an "ok" appetite at home and does not good a lot, but reports it is adequate. Pt's meals at home would not be large in quantity. Pt reports no weight loss with usual body weight of 115 lbs. Pt reports she has an "ok" appetite now and is not very hungry to eat. Pt reports she has not eating anything this AM and she is ok with it. Pt sometimes drinks Boost at home and is willing to drink some while hospitalized--will order. Pt was encouraged to drink oral supplements at home to aid in consumption of extra calories and protein. Pt denies any stomach pains, nausea or difficulties chewing or swallowing.  Nutrition Focused Physical  Exam:  Subcutaneous Fat:  Orbital Region: N/A Upper Arm Region: Moderate depletion Thoracic and Lumbar Region: Moderate depletion  Muscle:  Temple Region: WNL Clavicle Bone Region: Moderate depletion Clavicle and Acromion Bone Region: Moderate depletion Scapular Bone Region: N/A Dorsal Hand: Moderate depletion Patellar Region: WNL Anterior Thigh Region: WNL Posterior Calf Region: N/A  Edema: none  Labs: Low GFR High Glucose  Height: Ht Readings from Last 1 Encounters:  11/20/13 5' (1.524 m)    Weight: Wt Readings from Last 1 Encounters:  11/20/13 117 lb 1 oz (53.1 kg)    Ideal Body Weight: 100 lb  % Ideal Body Weight: 117%  Wt Readings from Last 10 Encounters:  11/20/13 117 lb 1 oz (53.1 kg)  01/18/12 120 lb (54.432 kg)    Usual Body Weight: 115 lb  % Usual Body Weight: 102%  BMI:  Body mass index is 22.86 kg/(m^2). Normal  Estimated Nutritional Needs: Kcal: 1450-1750 Protein: 65-75 grams Fluid: 1.5 - 1.8 L/day  Skin: no issues noted  Diet Order: General  EDUCATION NEEDS: -No education needs identified at this time  No intake or output data in the 24 hours ending 11/20/13 1007  Last BM: 3/18   Labs:   Recent Labs Lab 11/19/13 2315  NA 143  K 4.7  CL 103  CO2 31  BUN 20  CREATININE 0.80  CALCIUM 9.3  GLUCOSE 101*    CBG (last 3)  No results found for this basename: GLUCAP,  in the last 72 hours  Scheduled Meds: . atorvastatin  10 mg  Oral Daily  . donepezil  10 mg Oral QHS  . folic acid  1 mg Oral Daily  . gabapentin  100 mg Oral Daily  . lisinopril  10 mg Oral Daily  . LORazepam  0.5 mg Oral 3 times per day  . [START ON 11/26/2013] methotrexate  17.5 mg Oral Weekly  . NIFEdipine  30 mg Oral Daily  . sertraline  100 mg Oral Daily    Continuous Infusions: . heparin      Past Medical History  Diagnosis Date  . COPD (chronic obstructive pulmonary disease)   . Stroke   . A-fib     Past Surgical History  Procedure  Laterality Date  . Cholecystectomy    . Abdominal hysterectomy      Karen Adams Dietetic Intern Pager: [phone]

## 2013-11-20 NOTE — Consult Note (Signed)
Name: Karen Adams MRN: [id number] DOB: [birth year]    ADMISSION DATE:  11/19/2013 CONSULTATION DATE:  11/20/13  REFERRING MD :  Dr. Charlies Silvers  PRIMARY SERVICE:  TR[facility]  CHIEF COMPLAINT:  Lung Mass   BRIEF PATIENT DESCRIPTION: 80 y/o F admitted 3/19 with progressive low back pain and shortness of breath.    SIGNIFICANT EVENTS: Jan 2015 - Admit to [facility] with resp distress, rx for PNA (?).  CT + for LUL mass.  F/U bronch with reported results as "mucus plug" .............................................................................................................................................................................................................. 3/19 - Admit with progressive low back pain, shortness of breath   STUDIES:  3/19 - CTA Chest >> negative for PE, but noted LUL mass with new mediastinal & L hilar lymphadenopathy, bilateral pulmonary nodules & a destructive lesion at T2 consistent with primary bronchogenic carcinoma with metastatic disease.    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  Patient was admitted at the end of January to [facility] for respiratory distress.  Daughter, who is an Therapist, sports, reports she was treated with IV abx and had a CT scan that was positive for a lung mass.  She was discharged on PRN oxygen. Patient was brought back for a bronchoscopy ([facility])with reported mucus plugging, no endobronchial lesions.  Daughter notes an approximate 5-8 lb weight loss over the last year.  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  Currently, she does not drive due to short term memory but is independent of all ADL's otherwise.   ER  evaluation included a chest and lumbar spine xray.  CXR demonstrated a left suprahilar soft tissue mass.  Lumbar film was negative for acute findings.  Further assessment with CTA of Chest was negative for PE, but noted LUL mass with new mediastinal & L hilar lymphadenopathy, bilateral pulmonary nodules & a destructive lesion at T2 consistent with primary bronchogenic carcinoma with metastatic disease.  She was admitted to [facility] per [facility] to medical floor, coumadin reversed and placed on a heparin gtt in anticipation of pending biopsy.  PCCM consulted for evaluation / tissue sampling.     PAST MEDICAL HISTORY :  Past Medical History  Diagnosis Date  . COPD (chronic obstructive pulmonary disease)   . Stroke   . A-fib    Past Surgical History  Procedure Laterality Date  . Cholecystectomy    . Abdominal hysterectomy     Prior to Admission medications   Medication Sig Start Date End Date Taking? Authorizing Provider  acetaminophen (TYLENOL) 325 MG tablet Take 650 mg by mouth every 6 (six) hours as needed for mild pain or headache.   Yes Historical Provider, MD  albuterol (PROVENTIL HFA;VENTOLIN HFA) 108 (90 BASE) MCG/ACT inhaler Inhale 2 puffs into the lungs every 6 (six) hours as needed for wheezing or shortness of breath.   Yes Historical Provider, MD  atorvastatin (LIPITOR) 10 MG tablet Take 10 mg by mouth daily.   Yes Historical Provider, MD  donepezil (ARICEPT) 10 MG tablet Take 10 mg by mouth at bedtime.   Yes Historical Provider, MD  folic acid (FOLVITE) 1 MG tablet Take 1 mg by mouth daily.   Yes Historical Provider, MD  gabapentin (NEURONTIN) 600 MG tablet Take 600 mg by mouth at bedtime.   Yes Historical Provider, MD  lisinopril (PRINIVIL,ZESTRIL) 10 MG tablet Take 10 mg by mouth daily.   Yes Historical Provider, MD  LORazepam (ATIVAN) 0.5 MG tablet Take 0.5 mg by mouth every 8 (eight) hours as needed for anxiety or sleep.    Yes Historical Provider, MD  methotrexate (RHEUMATREX) 2.5 MG  tablet Take 17.5 mg by mouth every Wednesday. One to Seven tabs per week   Yes Historical Provider, MD  metoprolol tartrate (LOPRESSOR) 25 MG tablet Take 12.5 mg by mouth 2 (two) times daily.  10/29/13  Yes Historical Provider, MD  sertraline (ZOLOFT) 100 MG tablet Take 100 mg by mouth daily.   Yes Historical Provider, MD  tiotropium (SPIRIVA HANDIHALER) 18 MCG inhalation capsule Place 18 mcg into inhaler and inhale daily.   Yes Historical Provider, MD  warfarin (COUMADIN) 10 MG tablet Take 10 mg by mouth daily.   Yes Historical Provider, MD   Allergies  Allergen Reactions  . Shellfish Allergy Other (See Comments)    Family is unsure  . Latex Rash    FAMILY HISTORY:  History reviewed. No pertinent family history.  SOCIAL HISTORY:  reports that she quit smoking about 29 years ago. She has never used smokeless tobacco. She reports that she does not drink alcohol. Her drug history is not on file.  REVIEW OF SYSTEMS:   Constitutional: Negative for fever, chills, weight loss, malaise/fatigue and diaphoresis.  HENT: Negative for hearing loss, ear pain, nosebleeds, congestion, sore throat, neck pain, tinnitus and ear discharge.   Eyes: Negative for blurred vision, double vision, photophobia, pain, discharge and redness.  Respiratory: Negative for cough, hemoptysis, sputum production, shortness of breath, wheezing and stridor.   Cardiovascular: Negative for chest pain, palpitations, orthopnea, claudication, leg swelling and PND.  Gastrointestinal: Negative for heartburn, nausea, vomiting, abdominal pain, diarrhea, constipation, blood in stool and melena.  Genitourinary: Negative for dysuria, urgency, frequency, hematuria and flank pain.  Musculoskeletal: Negative for myalgias, back pain, joint pain and falls.  Skin: Negative for itching and rash.  Neurological: Negative for dizziness, tingling, tremors, sensory change, speech change, focal weakness, seizures, loss of consciousness, weakness and  headaches.  Endo/Heme/Allergies: Negative for environmental allergies and polydipsia. Does not bruise/bleed easily.  SUBJECTIVE: Pt reports back pain with activity  VITAL SIGNS: Temp:  [97.6 F (36.4 C)-98.3 F (36.8 C)] 98.3 F (36.8 C) (03/19 0510) Pulse Rate:  [82-88] 85 (03/19 0510) Resp:  [18-20] 20 (03/19 0510) BP: (140-153)/(43-76) 140/50 mmHg (03/19 0510) SpO2:  [95 %-99 %] 95 % (03/19 0510) Weight:  [112 lb (50.803 kg)-117 lb 1 oz (53.1 kg)] 117 lb 1 oz (53.1 kg) (03/19 0510)  PHYSICAL EXAMINATION: General:  wdwn elderly female in NAD Neuro:  Awake, alert, appropriate, short term memory deficit HEENT:  Mm pink / moist, no jvd  Cardiovascular:  s1s2 irr irr, no m/r/g Lungs:  resp's even/non-labored, lungs bilaterally clear Abdomen:  Round / soft, bsx4 active Musculoskeletal:  No acute deformities Skin:  Warm/dry, no edema   Recent Labs Lab 11/19/13 2315  NA 143  K 4.7  CL 103  CO2 31  BUN 20  CREATININE 0.80  GLUCOSE 101*    Recent Labs Lab 11/19/13 2315  HGB 12.5  HCT 39.3  WBC 9.4  PLT 335   Dg Chest 2 View  11/20/2013   CLINICAL DATA:  Shortness of breath.  EXAM: CHEST  2 VIEW  COMPARISON:  DG  CHEST 1V dated 09/23/2013; DG CHEST 2V dated 09/15/2013; CT ANGIO CHEST W/CM &/OR WO/CM dated 01/18/2012  FINDINGS: Left suprahilar soft tissue mass versus infiltrate noted. Chest CT is suggested for further evaluation. Small left pleural effusion. No pneumothorax. Bilateral tiny questionable pulmonary nodules are present. These can be further evaluated with chest CT. Heart size normal. No focal bony abnormality identified. Prior cholecystectomy.  IMPRESSION: 1. Left suprahilar soft tissue mass versus infiltrate. This findings are worrisome for a tumor. Chest CT is suggested for further evaluation.  2. Cannot exclude tiny bilateral pulmonary nodules. This can also be evaluated with chest CT.  3. Small left pleural effusion.   Electronically Signed   By: Marcello Moores  Register    On: 11/20/2013 00:35   Dg Lumbar Spine Complete  11/20/2013   CLINICAL DATA:  Right lower back pain beginning 4 days ago.  EXAM: LUMBAR SPINE - COMPLETE 4+ VIEW  COMPARISON:  CT chest, abdomen and pelvis 07/07/2010.  FINDINGS: Vertebral body height and alignment are maintained. There is mild loss of disc space height at L4-5. No pars interarticularis defect is identified. There is some facet degenerative change at L4-5 and L5-S1. Atherosclerotic vascular disease is noted. Cholecystectomy clips are seen.  IMPRESSION: No acute finding.  Mild lower lumbar degenerative change.   Electronically Signed   By: Inge Rise M.D.   On: 11/20/2013 00:32   Ct Angio Chest Pe W/cm &/or Wo Cm  11/20/2013   CLINICAL DATA:  Abnormal chest x-ray with a possible mass in the left lung.  EXAM: CT ANGIOGRAPHY CHEST WITH CONTRAST  TECHNIQUE: Multidetector CT imaging of the chest was performed using the standard protocol during bolus administration of intravenous contrast. Multiplanar CT image reconstructions and MIPs were obtained to evaluate the vascular anatomy.  CONTRAST:  80 mL OMNIPAQUE IOHEXOL 350 MG/ML SOLN  COMPARISON:  PA and lateral chest 11/20/2013 at 12:22 a.m. Single view of the chest 09/23/2013. CT chest, abdomen and pelvis 09/15/2013.  FINDINGS: No pulmonary embolus is identified. Left upper lobe nodule which had measured 2.6 x 1.4 cm on the prior study today measures 4.3 by 2.6 cm on image 29 of series 7. There is new AP window lymphadenopathy with a node measuring 2.5 by 2.3 cm seen on image 34. This lymph node is contiguous with the patient's mass. New infrahilar lymph node on image 46 measures 1.2 cm. The patient has a new small left pleural effusion. There is no right pleural effusion or pericardial effusion. Extensive atherosclerotic vascular disease is unchanged in appearance with a penetrating ulcer of the descending thoracic aorta identified, unchanged. There is no axillary lymphadenopathy.  Lungs again  demonstrate extensive centrilobular emphysematous change. There is a new right upper lobe nodule on image 18 measuring 0.5 cm in diameter. A second new right upper lobe nodule on image 20 measures 0.5 cm. A new left lower lobe nodule on image 36 measures 0.8 cm. Multiple additional subcentimeter pulmonary nodules are identified bilaterally.  Visualized upper abdomen is unremarkable. Bones demonstrate a destructive lesion in the posterior aspect of the T2 vertebral body which is not seen on the most recent CT. No other or destructive lesion is seen. No lytic lesion is identified.  Review of the MIP images confirms the above findings.  IMPRESSION: Negative for pulmonary embolus.  Marked increase in the size of a left upper lobe mass with new mediastinal and left hilar lymphadenopathy, bilateral pulmonary nodules and a destructive lesion in T2 all consistent with primary bronchogenic carcinoma and  metastatic disease.   Electronically Signed   By: Inge Rise M.D.   On: 11/20/2013 01:42    ASSESSMENT / PLAN:  Lung Mass w Hilar / Mediastinal Adenopathy - presumed primary lung with thoracic metastatic disease Bilateral Pulmonary Nodules LUL mass seems to have grown in size since last CT in jan & nodules are new.  Plan: -outpatient PET scan arranged for 3/27, then proceed with tissue sampling after review, if another area is more amenable to biopsy to avoid general anesthesia if possible (afib, cva hx, known aneurysm). AP window, in general, is difficult to approach by EBUS but it does seem on cross sectional view that this may be close to the trachea & reachable by EBUS. -ok to resume coumadin, we will instruct patient when to hold for further testing -follow up with Dr. Elsworth Soho after PET scan (3/27) in [facility] (see discharge appt's) -oxygen for saturations >92%, new home oxygen since Jan admit at [facility] (has not used).   A-Fib on chronic anticoagulation - s/p coumadin reversal on admit  Plan: -ok to  transition back to coumadin, INR am 3/19 was 2.05   Pain   Plan: -recommend trial of oral narcotics while inpatient to determine effect -consider low dose for d/c + anxiolytic  Noe Gens, NP-C [facility] Pgr: [phone] or [phone]  Independently examined pt, evaluated data & formulated above care plan with NP who scribed this note & edited by me. Discussed care plan with pt & daughter who were in agreement.  Sevrin Sally V.   11/20/2013, 10:41 AM

## 2013-11-20 NOTE — H&P (Signed)
[facility] History and Physical  Krisha Beegle YQM:[id number] DOB: 21-Jun-1929 DOA: 11/19/2013  Referring physician: EDP PCP: Truitt Merle, NP   Chief Complaint: SOB   HPI: Karen Adams is a 80 y.o. female who presents to the ED with back ache and SOB.  Her R lower back pain onset 4 days ago, constant and progressively worsening over the course of the past 4 days.  Her SOB has been worsening progressively since January of this year.  Per daughter patient had bronchoscopy in January after her CT scan showed a mass vs mucous plugging, however this came back negative for malignancy.  Unfortunately in the ED today, repeat imaging was performed which shows marked growth of the mass, spread to lymphnodes, and an apparent T2 spinal met.  Patient has been transferred to [facility] for further work up on what is now clearly a malignant neoplastic process.  Review of Systems: Systems reviewed.  As above, otherwise negative  Past Medical History  Diagnosis Date  . COPD (chronic obstructive pulmonary disease)   . Stroke   . A-fib    Past Surgical History  Procedure Laterality Date  . Cholecystectomy    . Abdominal hysterectomy     Social History:  reports that she quit smoking about 29 years ago. She has never used smokeless tobacco. She reports that she does not drink alcohol. Her drug history is not on file.  Allergies  Allergen Reactions  . Latex Rash    History reviewed. No pertinent family history.   Prior to Admission medications   Medication Sig Start Date End Date Taking? Authorizing Provider  atorvastatin (LIPITOR) 10 MG tablet Take 10 mg by mouth daily.   Yes Historical Provider, MD  donepezil (ARICEPT) 10 MG tablet Take 10 mg by mouth at bedtime.   Yes Historical Provider, MD  folic acid (FOLVITE) 1 MG tablet Take 1 mg by mouth daily.   Yes Historical Provider, MD  gabapentin (NEURONTIN) 100 MG capsule Take 100 mg by mouth 1 day or 1 dose. ONE TO THREE A DAY   Yes Historical  Provider, MD  lisinopril (PRINIVIL,ZESTRIL) 10 MG tablet Take 10 mg by mouth daily.   Yes Historical Provider, MD  LORazepam (ATIVAN) 0.5 MG tablet Take 0.5 mg by mouth every 8 (eight) hours.   Yes Historical Provider, MD  methotrexate (RHEUMATREX) 2.5 MG tablet Take 7.5 mg by mouth 3 (three) times a week. ONE TO SEVEN TABS A WEEK   Yes Historical Provider, MD  NIFEdipine (PROCARDIA XL/ADALAT-CC) 30 MG 24 hr tablet Take 30 mg by mouth daily.   Yes Historical Provider, MD  sertraline (ZOLOFT) 100 MG tablet Take 100 mg by mouth daily.   Yes Historical Provider, MD  warfarin (COUMADIN) 10 MG tablet Take 10 mg by mouth daily.   Yes Historical Provider, MD   Physical Exam: Filed Vitals:   11/20/13 0249  BP: 153/43  Pulse: 82  Temp: 98.2 F (36.8 C)  Resp: 18    BP 153/43  Pulse 82  Temp(Src) 98.2 F (36.8 C) (Oral)  Resp 18  Ht 5' (1.524 m)  Wt 50.803 kg (112 lb)  BMI 21.87 kg/m2  SpO2 99%  General Appearance:    Alert, oriented, no distress, appears stated age  Head:    Normocephalic, atraumatic  Eyes:    PERRL, EOMI, sclera non-icteric        Nose:   Nares without drainage or epistaxis. Mucosa, turbinates normal  Throat:   Moist mucous membranes. Oropharynx without  erythema or exudate.  Neck:   Supple. No carotid bruits.  No thyromegaly.  No lymphadenopathy.   Back:     No CVA tenderness, no spinal tenderness  Lungs:     Clear to auscultation bilaterally, without wheezes, rhonchi or rales  Chest wall:    No tenderness to palpitation  Heart:    Regular rate and rhythm without murmurs, gallops, rubs  Abdomen:     Soft, non-tender, nondistended, normal bowel sounds, no organomegaly  Genitalia:    deferred  Rectal:    deferred  Extremities:   No clubbing, cyanosis or edema.  Pulses:   2+ and symmetric all extremities  Skin:   Skin color, texture, turgor normal, no rashes or lesions  Lymph nodes:   Cervical, supraclavicular, and axillary nodes normal  Neurologic:   CNII-XII  intact. Normal strength, sensation and reflexes      throughout    Labs on Admission:  Basic Metabolic Panel:  Recent Labs Lab 11/19/13 2315  NA 143  K 4.7  CL 103  CO2 31  GLUCOSE 101*  BUN 20  CREATININE 0.80  CALCIUM 9.3   Liver Function Tests: No results found for this basename: AST, ALT, ALKPHOS, BILITOT, PROT, ALBUMIN,  in the last 168 hours No results found for this basename: LIPASE, AMYLASE,  in the last 168 hours No results found for this basename: AMMONIA,  in the last 168 hours CBC:  Recent Labs Lab 11/19/13 2315  WBC 9.4  NEUTROABS 6.0  HGB 12.5  HCT 39.3  MCV 94.7  PLT 335   Cardiac Enzymes:  Recent Labs Lab 11/19/13 2315  TROPONINI <0.30    BNP (last 3 results) No results found for this basename: PROBNP,  in the last 8760 hours CBG: No results found for this basename: GLUCAP,  in the last 168 hours  Radiological Exams on Admission: Dg Chest 2 View  11/20/2013   CLINICAL DATA:  Shortness of breath.  EXAM: CHEST  2 VIEW  COMPARISON:  DG CHEST 1V dated 09/23/2013; DG CHEST 2V dated 09/15/2013; CT ANGIO CHEST W/CM &/OR WO/CM dated 01/18/2012  FINDINGS: Left suprahilar soft tissue mass versus infiltrate noted. Chest CT is suggested for further evaluation. Small left pleural effusion. No pneumothorax. Bilateral tiny questionable pulmonary nodules are present. These can be further evaluated with chest CT. Heart size normal. No focal bony abnormality identified. Prior cholecystectomy.  IMPRESSION: 1. Left suprahilar soft tissue mass versus infiltrate. This findings are worrisome for a tumor. Chest CT is suggested for further evaluation.  2. Cannot exclude tiny bilateral pulmonary nodules. This can also be evaluated with chest CT.  3. Small left pleural effusion.   Electronically Signed   By: Marcello Moores  Register   On: 11/20/2013 00:35   Dg Lumbar Spine Complete  11/20/2013   CLINICAL DATA:  Right lower back pain beginning 4 days ago.  EXAM: LUMBAR SPINE - COMPLETE  4+ VIEW  COMPARISON:  CT chest, abdomen and pelvis 07/07/2010.  FINDINGS: Vertebral body height and alignment are maintained. There is mild loss of disc space height at L4-5. No pars interarticularis defect is identified. There is some facet degenerative change at L4-5 and L5-S1. Atherosclerotic vascular disease is noted. Cholecystectomy clips are seen.  IMPRESSION: No acute finding.  Mild lower lumbar degenerative change.   Electronically Signed   By: Inge Rise M.D.   On: 11/20/2013 00:32   Ct Angio Chest Pe W/cm &/or Wo Cm  11/20/2013   CLINICAL DATA:  Abnormal chest  x-ray with a possible mass in the left lung.  EXAM: CT ANGIOGRAPHY CHEST WITH CONTRAST  TECHNIQUE: Multidetector CT imaging of the chest was performed using the standard protocol during bolus administration of intravenous contrast. Multiplanar CT image reconstructions and MIPs were obtained to evaluate the vascular anatomy.  CONTRAST:  80 mL OMNIPAQUE IOHEXOL 350 MG/ML SOLN  COMPARISON:  PA and lateral chest 11/20/2013 at 12:22 a.m. Single view of the chest 09/23/2013. CT chest, abdomen and pelvis 09/15/2013.  FINDINGS: No pulmonary embolus is identified. Left upper lobe nodule which had measured 2.6 x 1.4 cm on the prior study today measures 4.3 by 2.6 cm on image 29 of series 7. There is new AP window lymphadenopathy with a node measuring 2.5 by 2.3 cm seen on image 34. This lymph node is contiguous with the patient's mass. New infrahilar lymph node on image 46 measures 1.2 cm. The patient has a new small left pleural effusion. There is no right pleural effusion or pericardial effusion. Extensive atherosclerotic vascular disease is unchanged in appearance with a penetrating ulcer of the descending thoracic aorta identified, unchanged. There is no axillary lymphadenopathy.  Lungs again demonstrate extensive centrilobular emphysematous change. There is a new right upper lobe nodule on image 18 measuring 0.5 cm in diameter. A second new right  upper lobe nodule on image 20 measures 0.5 cm. A new left lower lobe nodule on image 36 measures 0.8 cm. Multiple additional subcentimeter pulmonary nodules are identified bilaterally.  Visualized upper abdomen is unremarkable. Bones demonstrate a destructive lesion in the posterior aspect of the T2 vertebral body which is not seen on the most recent CT. No other or destructive lesion is seen. No lytic lesion is identified.  Review of the MIP images confirms the above findings.  IMPRESSION: Negative for pulmonary embolus.  Marked increase in the size of a left upper lobe mass with new mediastinal and left hilar lymphadenopathy, bilateral pulmonary nodules and a destructive lesion in T2 all consistent with primary bronchogenic carcinoma and metastatic disease.   Electronically Signed   By: Inge Rise M.D.   On: 11/20/2013 01:42    EKG: Independently reviewed.  Assessment/Plan Principal Problem:   Lung cancer Active Problems:   A-fib   1. Lung Cancer - unfortunately the patients clinical picture is one of bronchogenic carcinoma until proven otherwise.  Her lung mass has demonstrated "marked increase in size", now associated with new mediastinal and hilar lymphadenopathy, and a destructive lesion in T2.  I called and spoke with on call for PCCM, they will perform routine pulm consultation in AM.  At this point we anticipate EBUS for tissue sampling and pathologic diagnosis.  Have not made patient NPO just yet for procedure as it is unlikely to happen today due to chronic coumadin usage. 2. A.Fib - on chronic coumadin at home for stroke prevention, to facilitate EBUS procedure have held coumadin and will reverse with Vit K PO, will have pharmacy switch the patient over to short acting heparin gtt for anticoagulation in the meantime.   Code Status: Full Code  Family Communication: Daughter at bedside Disposition Plan: Admit to inpatient   Time spent: 45 min  Bellanie Matthew M. [facility] Pager [phone]  If 7AM-7PM, please contact the day team taking care of the patient [email or web address] Password [email or web address] 11/20/2013, 5:07 AM

## 2013-11-20 NOTE — Discharge Instructions (Signed)

## 2013-11-20 NOTE — Progress Notes (Signed)
ANTICOAGULATION CONSULT NOTE - Initial Consult  Pharmacy Consult for Heparin Indication: atrial fibrillation  Allergies  Allergen Reactions  . Shellfish Allergy Other (See Comments)    Family is unsure  . Latex Rash    Patient Measurements: Height: 5' (152.4 cm) Weight: 117 lb 1 oz (53.1 kg) IBW/kg (Calculated) : 45.5 Heparin Dosing Weight:   Vital Signs: Temp: 98.3 F (36.8 C) (03/19 0510) Temp src: Oral (03/19 0510) BP: 140/50 mmHg (03/19 0510) Pulse Rate: 85 (03/19 0510)  Labs:  Recent Labs  11/19/13 2315 11/20/13 0640  HGB 12.5  --   HCT 39.3  --   PLT 335  --   LABPROT  --  22.5*  INR  --  2.05*  CREATININE 0.80  --   TROPONINI <0.30  --     Estimated Creatinine Clearance: 38.9 ml/min (by C-G formula based on Cr of 0.8).   Medical History: Past Medical History  Diagnosis Date  . COPD (chronic obstructive pulmonary disease)   . Stroke   . A-fib     Medications:  Infusions:  . heparin      Assessment: Patient with afib on chronic warfarin.  INR 2.05 ~1hour after 5mg  vitamin K PO.  Expect INR to decrease further.  Goal of Therapy:  Heparin level 0.3-0.7 units/ml Monitor platelets by anticoagulation protocol: Yes   Plan:  Heparin drip at 750 units/hr Check heparin level, INR at 1700 Daily CBC  Nani Skillern Crowford 11/20/2013,7:19 AM

## 2013-11-20 NOTE — Care Management Note (Signed)
UR complete    Ronav Furney,MSN,RN [phone] 

## 2013-11-20 NOTE — Progress Notes (Signed)
DC instructions reviewed with patient and daughter.  Home med rec carefully reviewed and the time of the next dose for each med was written on the AVS for the patient.  No changes noted since am assessment.  IV DC'ed.  Rx's given and f/u given.  Patient to have PET scan and then f/u with Dr. Elsworth Soho.  Patient's AVS noted an EBUS on 3/23.  Spoke with Velna Hatchet, NP to confirm that this was cancelled.  She confirmed and stated that Dr. Elsworth Soho would be cancelling that procedure. Patient notified.  Patient to DC to home with daughter.  Patient and daughter deny further questions or concerns at this time.

## 2013-11-20 NOTE — Care Management Note (Signed)
Cm spoke with patient at the bedside with adult daughter present. MD order for [facility].  Pt offered choice for [facility].Per pt choice [facility] to provide [facility] services. No other needs identified.    Venita Lick Wolfe Camarena,MSN,RN [phone]

## 2013-11-20 NOTE — Plan of Care (Addendum)
80 yo F with wheezing, CT chest shows neoplasm suspicious for bronchogenic carcinoma, also a spinal column met as well.  EDP calling PCCM to make sure they can do routine bronchoscopy over here, if they can then will accept patient to [facility].  Plan is bronchoscopy for biopsy and tissue diagnosis if PCCM feels this appropriate.  FNA by IR otherwise.

## 2013-11-20 NOTE — Progress Notes (Signed)
I agree with dietetic intern's assessment.   Mikey College MS, [place], [place] Pager [phone] After Hours Pager

## 2013-11-20 NOTE — Discharge Summary (Signed)
Physician Discharge Summary  Karen Adams SLH:[id number] DOB: 02-Jun-1929 DOA: 11/19/2013  PCP: Truitt Merle, NP  Admit date: 11/19/2013 Discharge date: 11/20/2013  Recommendations for Outpatient Follow-up:  1. Please continue coumadin per home dose. Recheck INR tomorrow and adjust the dose as necessary to reflect INR range 2-3 2. Pulmonary will arrange outpatient PET scan prior to doing bronchoscopy. Please follow up with them in the office per scheduled appointment.  Discharge Diagnoses:  Principal Problem:   Lung cancer Active Problems:   A-fib   Shortness of breath    Discharge Condition: medically stable for discharge  home with [place] orders in place  Diet recommendation: as tolerated   History of present illness:  80 y.o. female with past medical history of atrial fibrillation on coumadin, dementia, hypertension who presented with intractable back pain for 4 days prior to this admission. Pt also reported shortness of breath ever since she had bronchoscopy in January after her CT scan which showed a mass vs mucous plugging, however this came back negative for malignancy. In the ED today, repeat imaging was performed which showed marked growth of the mass, spread to lymphnodes, and an apparent T2 spinal met. Patient was transferred to [facility] for further work up on what is now clearly a malignant neoplastic process.  Assessment and Plan:  Principal Problem: Lung mass - per pulmonary they first need PET scan and they will arrange outpt bronchoscopy - pt will resume coumadin on discharge  - once diagnosis is confirmed she will need to be established with oncology for further treatment options and likely RT for palliative purpose but they need confirmation of malignancy and type of malignancy first prior to any treatment initiation    Signed:  Leisa Lenz, MD  [facility] 11/20/2013, 2:08 PM  Pager #: [phone]    Discharge Exam: Filed Vitals:   11/20/13 0510  BP:  140/50  Pulse: 85  Temp: 98.3 F (36.8 C)  Resp: 20   Filed Vitals:   11/19/13 2239 11/20/13 0249 11/20/13 0510  BP: 140/76 153/43 140/50  Pulse: 88 82 85  Temp: 97.6 F (36.4 C) 98.2 F (36.8 C) 98.3 F (36.8 C)  TempSrc: Oral Oral Oral  Resp: _0 Height: 5' (1.524 m)  5' (1.524 m)  Weight: 50.803 kg (112 lb)  53.1 kg (117 lb 1 oz)  SpO2: 98% 99% 95%    General: Pt is alert, follows commands appropriately, not in acute distress Cardiovascular: Regular rate and rhythm, S1/S2 +, no murmurs, no rubs, no gallops Respiratory: Clear to auscultation bilaterally, no wheezing, no crackles, no rhonchi Abdominal: Soft, non tender, non distended, bowel sounds +, no guarding Extremities: no edema, no cyanosis, pulses palpable bilaterally DP and PT Neuro: Grossly nonfocal  Discharge Instructions  Discharge Orders   Future Appointments Provider Department Dept Phone   11/24/2013 7:30 AM [facility] [phone]   11/28/2013 7:00 AM [facility] [phone]   Pt should arrive15 minutes prior to scheduled appt time. Please inform patient that exam will take a minimum of 1 1/2 hours. Patient to be NPO 6 hours prior to exam  and should not take any insulin the day of exam.   12/04/2013 3:00 PM Rigoberto Noel, MD [facility] [phone]   Future Orders Complete By Expires   Call MD for:  difficulty breathing, headache or visual disturbances  As directed    Call MD for:  persistant  dizziness or light-headedness  As directed    Call MD for:  persistant nausea and vomiting  As directed    Call MD for:  severe uncontrolled pain  As directed    Diet - low sodium heart healthy  As directed    Discharge instructions  As directed    Comments:     1. Please continue coumadin per home dose. Recheck INR tomorrow and adjust the dose as necessary to reflect INR range 2-3 2.  Pulmonary will arrange outpatient PET scan prior to doing bronchoscopy. Please follow up with them in the office per scheduled appointment.   Increase activity slowly  As directed        Medication List         acetaminophen 325 MG tablet  Commonly known as:  TYLENOL  Take 650 mg by mouth every 6 (six) hours as needed for mild pain or headache.     albuterol 108 (90 BASE) MCG/ACT inhaler  Commonly known as:  PROVENTIL HFA;VENTOLIN HFA  Inhale 2 puffs into the lungs every 6 (six) hours as needed for wheezing or shortness of breath.     atorvastatin 10 MG tablet  Commonly known as:  LIPITOR  Take 1 tablet (10 mg total) by mouth daily.     donepezil 10 MG tablet  Commonly known as:  ARICEPT  Take 1 tablet (10 mg total) by mouth at bedtime.     folic acid 1 MG tablet  Commonly known as:  FOLVITE  Take 1 tablet (1 mg total) by mouth daily.     gabapentin 600 MG tablet  Commonly known as:  NEURONTIN  Take 1 tablet (600 mg total) by mouth at bedtime.     lisinopril 10 MG tablet  Commonly known as:  PRINIVIL,ZESTRIL  Take 1 tablet (10 mg total) by mouth daily.     LORazepam 0.5 MG tablet  Commonly known as:  ATIVAN  Take 0.5 mg by mouth every 8 (eight) hours as needed for anxiety or sleep.     methotrexate 2.5 MG tablet  Commonly known as:  RHEUMATREX  Take 17.5 mg by mouth every Wednesday. One to Seven tabs per week     metoprolol tartrate 25 MG tablet  Commonly known as:  LOPRESSOR  Take 0.5 tablets (12.5 mg total) by mouth 2 (two) times daily.     NIFEdipine 30 MG 24 hr tablet  Commonly known as:  PROCARDIA-XL/ADALAT CC  Take 1 tablet (30 mg total) by mouth daily.     oxyCODONE-acetaminophen 5-325 MG per tablet  Commonly known as:  PERCOCET/ROXICET  Take 2 tablets by mouth every 4 (four) hours as needed for severe pain.     sertraline 100 MG tablet  Commonly known as:  ZOLOFT  Take 1 tablet (100 mg total) by mouth daily.     SPIRIVA HANDIHALER 18 MCG inhalation  capsule  Generic drug:  tiotropium  Place 18 mcg into inhaler and inhale daily.     warfarin 10 MG tablet  Commonly known as:  COUMADIN  Take 10 mg by mouth daily.          The results of significant diagnostics from this hospitalization (including imaging, microbiology, ancillary and laboratory) are listed below for reference.    Significant Diagnostic Studies: Dg Chest 2 View  11/20/2013   CLINICAL DATA:  Shortness of breath.  EXAM: CHEST  2 VIEW  COMPARISON:  DG CHEST 1V dated 09/23/2013; DG CHEST 2V dated 09/15/2013; CT ANGIO CHEST  W/CM &/OR WO/CM dated 01/18/2012  FINDINGS: Left suprahilar soft tissue mass versus infiltrate noted. Chest CT is suggested for further evaluation. Small left pleural effusion. No pneumothorax. Bilateral tiny questionable pulmonary nodules are present. These can be further evaluated with chest CT. Heart size normal. No focal bony abnormality identified. Prior cholecystectomy.  IMPRESSION: 1. Left suprahilar soft tissue mass versus infiltrate. This findings are worrisome for a tumor. Chest CT is suggested for further evaluation.  2. Cannot exclude tiny bilateral pulmonary nodules. This can also be evaluated with chest CT.  3. Small left pleural effusion.   Electronically Signed   By: Marcello Moores  Register   On: 11/20/2013 00:35   Dg Lumbar Spine Complete  11/20/2013   CLINICAL DATA:  Right lower back pain beginning 4 days ago.  EXAM: LUMBAR SPINE - COMPLETE 4+ VIEW  COMPARISON:  CT chest, abdomen and pelvis 07/07/2010.  FINDINGS: Vertebral body height and alignment are maintained. There is mild loss of disc space height at L4-5. No pars interarticularis defect is identified. There is some facet degenerative change at L4-5 and L5-S1. Atherosclerotic vascular disease is noted. Cholecystectomy clips are seen.  IMPRESSION: No acute finding.  Mild lower lumbar degenerative change.   Electronically Signed   By: Inge Rise M.D.   On: 11/20/2013 00:32   Ct Angio Chest Pe  W/cm &/or Wo Cm  11/20/2013   CLINICAL DATA:  Abnormal chest x-ray with a possible mass in the left lung.  EXAM: CT ANGIOGRAPHY CHEST WITH CONTRAST  TECHNIQUE: Multidetector CT imaging of the chest was performed using the standard protocol during bolus administration of intravenous contrast. Multiplanar CT image reconstructions and MIPs were obtained to evaluate the vascular anatomy.  CONTRAST:  80 mL OMNIPAQUE IOHEXOL 350 MG/ML SOLN  COMPARISON:  PA and lateral chest 11/20/2013 at 12:22 a.m. Single view of the chest 09/23/2013. CT chest, abdomen and pelvis 09/15/2013.  FINDINGS: No pulmonary embolus is identified. Left upper lobe nodule which had measured 2.6 x 1.4 cm on the prior study today measures 4.3 by 2.6 cm on image 29 of series 7. There is new AP window lymphadenopathy with a node measuring 2.5 by 2.3 cm seen on image 34. This lymph node is contiguous with the patient's mass. New infrahilar lymph node on image 46 measures 1.2 cm. The patient has a new small left pleural effusion. There is no right pleural effusion or pericardial effusion. Extensive atherosclerotic vascular disease is unchanged in appearance with a penetrating ulcer of the descending thoracic aorta identified, unchanged. There is no axillary lymphadenopathy.  Lungs again demonstrate extensive centrilobular emphysematous change. There is a new right upper lobe nodule on image 18 measuring 0.5 cm in diameter. A second new right upper lobe nodule on image 20 measures 0.5 cm. A new left lower lobe nodule on image 36 measures 0.8 cm. Multiple additional subcentimeter pulmonary nodules are identified bilaterally.  Visualized upper abdomen is unremarkable. Bones demonstrate a destructive lesion in the posterior aspect of the T2 vertebral body which is not seen on the most recent CT. No other or destructive lesion is seen. No lytic lesion is identified.  Review of the MIP images confirms the above findings.  IMPRESSION: Negative for pulmonary  embolus.  Marked increase in the size of a left upper lobe mass with new mediastinal and left hilar lymphadenopathy, bilateral pulmonary nodules and a destructive lesion in T2 all consistent with primary bronchogenic carcinoma and metastatic disease.   Electronically Signed   By: Inge Rise  M.D.   On: 11/20/2013 01:42    Microbiology: No results found for this or any previous visit (from the past 240 hour(s)).   Labs: Basic Metabolic Panel:  Recent Labs Lab 11/19/13 2315  NA 143  K 4.7  CL 103  CO2 31  GLUCOSE 101*  BUN 20  CREATININE 0.80  CALCIUM 9.3   Liver Function Tests: No results found for this basename: AST, ALT, ALKPHOS, BILITOT, PROT, ALBUMIN,  in the last 168 hours No results found for this basename: LIPASE, AMYLASE,  in the last 168 hours No results found for this basename: AMMONIA,  in the last 168 hours CBC:  Recent Labs Lab 11/19/13 2315  WBC 9.4  NEUTROABS 6.0  HGB 12.5  HCT 39.3  MCV 94.7  PLT 335   Cardiac Enzymes:  Recent Labs Lab 11/19/13 2315  TROPONINI <0.30   BNP: BNP (last 3 results) No results found for this basename: PROBNP,  in the last 8760 hours CBG: No results found for this basename: GLUCAP,  in the last 168 hours  Time coordinating discharge: Over 30 minutes

## 2013-11-24 ENCOUNTER — Encounter (HOSPITAL_COMMUNITY): Admission: RE | Payer: Self-pay | Source: Ambulatory Visit

## 2013-11-24 ENCOUNTER — Ambulatory Visit (HOSPITAL_COMMUNITY): Admission: RE | Admit: 2013-11-24 | Payer: Medicare Other | Source: Ambulatory Visit | Admitting: Pulmonary Disease

## 2013-11-24 ENCOUNTER — Encounter (HOSPITAL_COMMUNITY): Payer: Medicare Other

## 2013-11-24 SURGERY — ENDOBRONCHIAL ULTRASOUND (EBUS)
Anesthesia: General | Laterality: Bilateral

## 2013-11-28 ENCOUNTER — Telehealth: Payer: Self-pay | Admitting: Pulmonary Disease

## 2013-11-28 ENCOUNTER — Encounter (HOSPITAL_COMMUNITY)
Admit: 2013-11-28 | Discharge: 2013-11-28 | Disposition: A | Discharge status: Home / Self Care | Payer: Medicare Other | Attending: Pulmonary Disease | Admitting: Pulmonary Disease

## 2013-11-28 ENCOUNTER — Encounter (HOSPITAL_COMMUNITY): Payer: Self-pay

## 2013-11-28 DIAGNOSIS — C771 Secondary and unspecified malignant neoplasm of intrathoracic lymph nodes: Secondary | ICD-10-CM | POA: Insufficient documentation

## 2013-11-28 DIAGNOSIS — C7889 Secondary malignant neoplasm of other digestive organs: Secondary | ICD-10-CM | POA: Insufficient documentation

## 2013-11-28 DIAGNOSIS — C3490 Malignant neoplasm of unspecified part of unspecified bronchus or lung: Secondary | ICD-10-CM

## 2013-11-28 DIAGNOSIS — C7951 Secondary malignant neoplasm of bone: Secondary | ICD-10-CM | POA: Insufficient documentation

## 2013-11-28 DIAGNOSIS — R222 Localized swelling, mass and lump, trunk: Secondary | ICD-10-CM | POA: Insufficient documentation

## 2013-11-28 DIAGNOSIS — C801 Malignant (primary) neoplasm, unspecified: Secondary | ICD-10-CM | POA: Insufficient documentation

## 2013-11-28 DIAGNOSIS — R918 Other nonspecific abnormal finding of lung field: Secondary | ICD-10-CM

## 2013-11-28 DIAGNOSIS — C7952 Secondary malignant neoplasm of bone marrow: Secondary | ICD-10-CM

## 2013-11-28 LAB — GLUCOSE, CAPILLARY: Glucose-Capillary: 118 mg/dL — ABNORMAL HIGH (ref 70–99)

## 2013-11-28 MED ORDER — FLUDEOXYGLUCOSE F - 18 (FDG) INJECTION
6.5000 | Freq: Once | INTRAVENOUS | Status: AC | PRN
  Administered 2013-11-28: 6.5 via INTRAVENOUS

## 2013-11-28 NOTE — Telephone Encounter (Signed)
Called and spoke with pt. Made her aware of RA recs/plan. She voiced her understanding.  Will let RA know

## 2013-11-28 NOTE — Telephone Encounter (Signed)
Discussed PET results with pt & daughter [name] [phone] STOP coumadin from today Will plan for thora early next week & if negative, then EBUS on 4/6

## 2013-12-01 ENCOUNTER — Encounter (HOSPITAL_COMMUNITY): Payer: Self-pay | Admitting: *Deleted

## 2013-12-01 NOTE — Telephone Encounter (Signed)
thracentrsis @[facility]  12/04/43@11 :00am and [email or web address]@[facility]  12/08/13@7 :30am daughter aware Joellen Jersey

## 2013-12-02 ENCOUNTER — Encounter (HOSPITAL_COMMUNITY): Payer: Self-pay | Admitting: Pharmacy Technician

## 2013-12-03 ENCOUNTER — Ambulatory Visit (HOSPITAL_COMMUNITY)
Admission: RE | Admit: 2013-12-03 | Discharge: 2013-12-03 | Disposition: A | Discharge status: Home / Self Care | Payer: Medicare Other | Source: Ambulatory Visit | Attending: Pulmonary Disease | Admitting: Pulmonary Disease

## 2013-12-03 ENCOUNTER — Ambulatory Visit (HOSPITAL_COMMUNITY)
Admission: RE | Admit: 2013-12-03 | Discharge: 2013-12-03 | Disposition: A | Discharge status: Home / Self Care | Payer: Medicare Other | Source: Ambulatory Visit | Attending: Radiology | Admitting: Radiology

## 2013-12-03 ENCOUNTER — Other Ambulatory Visit: Payer: Self-pay | Admitting: Pulmonary Disease

## 2013-12-03 DIAGNOSIS — R222 Localized swelling, mass and lump, trunk: Secondary | ICD-10-CM | POA: Insufficient documentation

## 2013-12-03 DIAGNOSIS — I700 Atherosclerosis of aorta: Secondary | ICD-10-CM | POA: Insufficient documentation

## 2013-12-03 DIAGNOSIS — C3490 Malignant neoplasm of unspecified part of unspecified bronchus or lung: Secondary | ICD-10-CM | POA: Insufficient documentation

## 2013-12-03 DIAGNOSIS — J90 Pleural effusion, not elsewhere classified: Secondary | ICD-10-CM | POA: Insufficient documentation

## 2013-12-03 HISTORY — PX: THORACENTESIS: SHX235

## 2013-12-03 LAB — BODY FLUID CELL COUNT WITH DIFFERENTIAL
Eos, Fluid: 18 %
LYMPHS FL: 29 %
Monocyte-Macrophage-Serous Fluid: 40 % — ABNORMAL LOW (ref 50–90)
NEUTROPHIL FLUID: 13 % (ref 0–25)
WBC FLUID: 1303 uL — AB (ref 0–1000)

## 2013-12-03 LAB — LACTATE DEHYDROGENASE, PLEURAL OR PERITONEAL FLUID: LD, Fluid: 62 U/L — ABNORMAL HIGH (ref 3–23)

## 2013-12-03 LAB — PROTEIN, BODY FLUID: Total protein, fluid: 3.4 g/dL

## 2013-12-03 NOTE — Procedures (Addendum)
Successful US guided left thoracentesis. Yielded 155ml of clear serous fluid. Pt tolerated procedure well. No immediate complications. Remaining left pleural fluid exists, procedure stopped secondary to patient's complaint of pain.  Specimen was sent for labs. CXR ordered.  Tsosie Billing D PA-C 12/03/2013 11:26 AM

## 2013-12-04 ENCOUNTER — Encounter: Payer: Self-pay | Admitting: Pulmonary Disease

## 2013-12-04 ENCOUNTER — Ambulatory Visit (INDEPENDENT_AMBULATORY_CARE_PROVIDER_SITE_OTHER): Payer: Medicare Other | Admitting: Pulmonary Disease

## 2013-12-04 VITALS — BP 110/58 | HR 55 | Wt 121.0 lb

## 2013-12-04 DIAGNOSIS — C3490 Malignant neoplasm of unspecified part of unspecified bronchus or lung: Secondary | ICD-10-CM

## 2013-12-04 DIAGNOSIS — C7952 Secondary malignant neoplasm of bone marrow: Secondary | ICD-10-CM

## 2013-12-04 DIAGNOSIS — C7951 Secondary malignant neoplasm of bone: Secondary | ICD-10-CM

## 2013-12-04 MED ORDER — TRAMADOL HCL 50 MG PO TABS: 50.0000 mg | ORAL_TABLET | Freq: Three times a day (TID) | ORAL | Status: DC | PRN

## 2013-12-04 MED ORDER — ONDANSETRON HCL 4 MG PO TABS: 4.0000 mg | ORAL_TABLET | Freq: Three times a day (TID) | ORAL | Status: AC | PRN

## 2013-12-04 NOTE — Patient Instructions (Signed)
Rx for tramadol 50 mg every 8h as needed Zofran 4 mg every 8h as needed Referral to oncology I will call with pleural fluid results & decide about bronchoscopy

## 2013-12-04 NOTE — Progress Notes (Signed)
   Subjective:    Patient ID: Karen Adams, female    DOB: 1929/05/22, 80 y.o.   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 Patient was admitted at the end of January to [facility] for respiratory distress. Daughter, who is an Therapist, sports, reports she was treated with IV abx and had a CT scan that was positive for a lung mass. She was discharged on PRN oxygen. Patient was brought back for a bronchoscopy ([facility])with reported mucus plugging, no endobronchial lesions. Daughter notes an approximate 5-8 lb weight loss over the last year. 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 Currently, she does not drive due to short term memory but is independent of all ADL's otherwise.  ER evaluation included a chest and lumbar spine xray. CXR demonstrated a left suprahilar soft tissue mass. Lumbar film was negative for acute findings. Further assessment with CTA of Chest was negative for PE, but noted LUL mass with new mediastinal & L hilar lymphadenopathy, bilateral pulmonary nodules & a destructive lesion at T2 consistent with primary bronchogenic carcinoma with metastatic disease.   PET scan Hypermetabolic left suprahilar mass , Bilateral hypermetabolic small pulmonary nodules , Hypermetabolic mediastinal nodes, Single metastasis to the body of the pancreas. Widespread skeletal metastasis    US guided thoracentesis yielded 150 cc of exudative fluid-atypical cells on cytology. She has  new home oxygen since Jan admit at [facility]  Complains of pain in her upper back Percocet makes her sleepy, her daughter gave her Ultram at some relief, c/o nausea x1 day     Review of Systems neg for any  significant sore throat, dysphagia, itching, sneezing, nasal congestion or excess/ purulent secretions, fever, chills, sweats, unintended wt loss, pleuritic or exertional cp, hempoptysis, orthopnea pnd or change in chronic leg swelling. Also denies presyncope, palpitations, heartburn, abdominal pain, nausea, vomiting, diarrhea or change in bowel or urinary habits, dysuria,hematuria, rash, arthralgias, visual complaints, headache, numbness weakness or ataxia.     Objective:   Physical Exam  Gen. Pleasant, frail, in no distress, normal affect ENT - no lesions, no post nasal drip Neck: No JVD, no thyromegaly, no carotid bruits Lungs: no use of accessory muscles, no dullness to percussion, decreased right base without rales or rhonchi  Cardiovascular: Rhythm regular, heart sounds  normal, no murmurs or gallops, no peripheral edema Abdomen: soft and non-tender, no hepatosplenomegaly, BS normal. Musculoskeletal: No deformities, no cyanosis or clubbing Neuro:  alert, non focal        Assessment & Plan:

## 2013-12-04 NOTE — Assessment & Plan Note (Signed)
  Referral to oncology I will call with pleural fluid results & decide about bronchoscopy - special stains show malignant cells then would defer bronchoscopy.

## 2013-12-04 NOTE — Assessment & Plan Note (Signed)
Rx for tramadol 50 mg every 8h as needed Zofran 4 mg every 8h as needed Refer to [facility] for palliative radiotherapy. Doubt that she is a candidate for chemotherapy.

## 2013-12-06 ENCOUNTER — Other Ambulatory Visit: Payer: Self-pay | Admitting: Pulmonary Disease

## 2013-12-06 LAB — BODY FLUID CULTURE: CULTURE: NO GROWTH

## 2013-12-06 NOTE — Telephone Encounter (Signed)
Daughter wants to cancel procedure and discuss palliative rx with Dr Elsworth Soho - will pass this along to Dr Elsworth Soho

## 2013-12-08 ENCOUNTER — Encounter (HOSPITAL_COMMUNITY): Payer: Self-pay | Admitting: Certified Registered Nurse Anesthetist

## 2013-12-08 ENCOUNTER — Ambulatory Visit (HOSPITAL_COMMUNITY): Admission: RE | Admit: 2013-12-08 | Payer: Medicare Other | Source: Ambulatory Visit | Admitting: Pulmonary Disease

## 2013-12-08 ENCOUNTER — Encounter (HOSPITAL_COMMUNITY): Payer: Medicare Other

## 2013-12-08 ENCOUNTER — Telehealth: Payer: Self-pay | Admitting: Pulmonary Disease

## 2013-12-08 HISTORY — DX: Unspecified osteoarthritis, unspecified site: M19.90

## 2013-12-08 HISTORY — DX: Nausea: R11.0

## 2013-12-08 HISTORY — DX: Malignant (primary) neoplasm, unspecified: C80.1

## 2013-12-08 HISTORY — DX: Cardiac arrhythmia, unspecified: I49.9

## 2013-12-08 HISTORY — DX: Shortness of breath: R06.02

## 2013-12-08 SURGERY — ENDOBRONCHIAL ULTRASOUND (EBUS)
Anesthesia: General | Laterality: Bilateral

## 2013-12-08 MED ORDER — LIDOCAINE HCL (CARDIAC) 20 MG/ML IV SOLN
INTRAVENOUS | Status: AC
  Filled 2013-12-08: qty 5

## 2013-12-08 MED ORDER — PROPOFOL 10 MG/ML IV BOLUS
INTRAVENOUS | Status: AC
  Filled 2013-12-08: qty 20

## 2013-12-08 MED ORDER — ROCURONIUM BROMIDE 100 MG/10ML IV SOLN
INTRAVENOUS | Status: AC
  Filled 2013-12-08: qty 1

## 2013-12-08 MED ORDER — FENTANYL CITRATE 0.05 MG/ML IJ SOLN
INTRAMUSCULAR | Status: AC
  Filled 2013-12-08: qty 2

## 2013-12-08 NOTE — Telephone Encounter (Signed)
Karen Noel, MD at 12/08/2013 7:52 AM     Status: Signed        Spoke to pt & daughter [name], pt's son now wants to be part of this discussion - OK to make appt for family discussion if they call  ---  Called and spoke with pt Son. He wants to know if pt needs to have the BX in order to see the oncologists? He reports pt has not been doing to well. He has some other questions as well and would like RA to call him. [name] can be reached at the # above. Please advise Dr. Elsworth Soho. Thanks

## 2013-12-08 NOTE — Telephone Encounter (Signed)
Spoke to son, answered all questions They want to focus on comfort Have rad Onc appt set up

## 2013-12-08 NOTE — Telephone Encounter (Signed)
Spoke to pt & daughter [name], pt's son now wants to be part of this discussion - OK to make appt for family discussion if they call

## 2013-12-10 ENCOUNTER — Encounter: Payer: Self-pay | Admitting: Oncology

## 2013-12-10 NOTE — Progress Notes (Signed)
Thoracic Location of Tumor / Histology: LUL mass with new mediastinal & L hilar lymphadenopathy, bilateral pulmonary nodule, single metastasis to the body of the pancreas and a destructive lesion at T2 consistent with primary bronchogenic carcinoma with metastatic disease.   Patient presented to [facility] ER on 3/18 with a 4 day history of progressively worsening low back pain without known mechanism of injury and shortness of breath  US guided Thoracentesis on 12/03/13 revealed:   Diagnosis PLEURAL FLUID, LEFT (SPECIMEN 1 OF 1 COLLECTED 12/03/13): ATYPICAL CELLS PRESENT, SEE COMMENT.  Tobacco/Marijuana/Snuff/ETOH use: former smoker.  Quite in 1980.  Smoked 1/2 ppd for 20 years.  Past/Anticipated interventions by cardiothoracic surgery, if any: none, patient not candidate for surgery/bipsy per family as informed by dr.Alva.  Past/Anticipated interventions by medical oncology, if NTZ:GYFV  Signs/Symptoms  Weight changes, if any:  Respiratory complaints, if CBS:WHQPRFFMB of breath  Hemoptysis, if any:No  Pain issues, if any upper back pain over last 60 days has become progressively worse over the last 2 weeks.  SAFETY ISSUES:  Prior radiation? no  Pacemaker/ICD? no  Possible current pregnancy? no  Is the patient on methotrexate? no  Current Complaints / other details:Patient main concern is back pain and weakness.For consideration of palliative radiation.

## 2013-12-11 ENCOUNTER — Ambulatory Visit
Admission: RE | Admit: 2013-12-11 | Discharge: 2013-12-11 | Disposition: A | Discharge status: Home / Self Care | Payer: Medicare Other | Source: Ambulatory Visit | Attending: Radiation Oncology | Admitting: Radiation Oncology

## 2013-12-11 ENCOUNTER — Encounter: Payer: Self-pay | Admitting: Radiation Oncology

## 2013-12-11 VITALS — BP 127/44 | HR 52 | Temp 97.8°F

## 2013-12-11 DIAGNOSIS — C7951 Secondary malignant neoplasm of bone: Secondary | ICD-10-CM

## 2013-12-11 DIAGNOSIS — C7952 Secondary malignant neoplasm of bone marrow: Secondary | ICD-10-CM

## 2013-12-11 DIAGNOSIS — Z79899 Other long term (current) drug therapy: Secondary | ICD-10-CM | POA: Insufficient documentation

## 2013-12-11 DIAGNOSIS — H3530 Unspecified macular degeneration: Secondary | ICD-10-CM | POA: Insufficient documentation

## 2013-12-11 DIAGNOSIS — Z87891 Personal history of nicotine dependence: Secondary | ICD-10-CM | POA: Insufficient documentation

## 2013-12-11 DIAGNOSIS — R413 Other amnesia: Secondary | ICD-10-CM | POA: Insufficient documentation

## 2013-12-11 DIAGNOSIS — R5381 Other malaise: Secondary | ICD-10-CM | POA: Insufficient documentation

## 2013-12-11 DIAGNOSIS — Z510 Encounter for antineoplastic radiation therapy: Secondary | ICD-10-CM | POA: Insufficient documentation

## 2013-12-11 DIAGNOSIS — R634 Abnormal weight loss: Secondary | ICD-10-CM | POA: Insufficient documentation

## 2013-12-11 DIAGNOSIS — J4489 Other specified chronic obstructive pulmonary disease: Secondary | ICD-10-CM | POA: Insufficient documentation

## 2013-12-11 DIAGNOSIS — I4891 Unspecified atrial fibrillation: Secondary | ICD-10-CM | POA: Insufficient documentation

## 2013-12-11 DIAGNOSIS — M545 Low back pain, unspecified: Secondary | ICD-10-CM | POA: Insufficient documentation

## 2013-12-11 DIAGNOSIS — J449 Chronic obstructive pulmonary disease, unspecified: Secondary | ICD-10-CM | POA: Insufficient documentation

## 2013-12-11 DIAGNOSIS — C7889 Secondary malignant neoplasm of other digestive organs: Secondary | ICD-10-CM | POA: Insufficient documentation

## 2013-12-11 DIAGNOSIS — Z7901 Long term (current) use of anticoagulants: Secondary | ICD-10-CM | POA: Insufficient documentation

## 2013-12-11 DIAGNOSIS — R5383 Other fatigue: Secondary | ICD-10-CM

## 2013-12-11 DIAGNOSIS — R627 Adult failure to thrive: Secondary | ICD-10-CM | POA: Insufficient documentation

## 2013-12-11 DIAGNOSIS — C3490 Malignant neoplasm of unspecified part of unspecified bronchus or lung: Secondary | ICD-10-CM | POA: Insufficient documentation

## 2013-12-11 DIAGNOSIS — Z8673 Personal history of transient ischemic attack (TIA), and cerebral infarction without residual deficits: Secondary | ICD-10-CM | POA: Insufficient documentation

## 2013-12-11 HISTORY — DX: Unspecified macular degeneration: H35.30

## 2013-12-11 MED ORDER — OXYCODONE HCL 5 MG PO TABS: 5.0000 mg | ORAL_TABLET | ORAL | Status: DC | PRN

## 2013-12-11 MED ORDER — PROCHLORPERAZINE MALEATE 10 MG PO TABS: 10.0000 mg | ORAL_TABLET | Freq: Four times a day (QID) | ORAL | Status: DC | PRN

## 2013-12-11 NOTE — Progress Notes (Signed)
Please see the Nurse Progress Note in the MD Initial Consult Encounter for this patient. 

## 2013-12-11 NOTE — Progress Notes (Signed)
Radiation Oncology         [phone]) [phone] ________________________________  Initial outpatient Consultation - Date: 12/11/2013   Name: Karen Adams MRN: [id number]   DOB: Aug 03, 1929  REFERRING PHYSICIAN: Rigoberto Noel, MD  DIAGNOSIS: Stage IV NSCLC (atypical cells on biopsy)  HISTORY OF PRESENT ILLNESS::Karen Adams is a 80 y.o. female  with back pain. She was admitted at the end of January for respiratory distress and was found to have a lung mass. She was brought back for bronchoscopy which was reported to be mucous plugging with no mass. Further workup of the back pain included a chest and lumbar spine x-ray. The chest x-ray showed a left suprahilar soft tissue mass. A CTA showed a left upper lobe mass with mediastinal left hilar adenopathy as well as bone bilateral pulmonary nodules and a lesion at T2. Pleural fluid was taken on 12/03/2013 which showed atypical cells but were not diagnostic of malignancy. She and her family declined further diagnostic workup. A PET scan was performed on 11/28/2013 which showed the suprahilar mass showed a SUV of 9.9 and measured 4.8 x 3.3 cm. Bilateral 1 cm pulmonary nodules were also hypermetabolic as were the prevascular paratracheal lymph nodes. The pleural effusion had increased on 327. Widespread bony metastatic disease was noted including a lytic lesion at T2 and another lesion at T5. Hypermetabolic activity was noted within the bones in the pelvis and sacrum. She was referred for possible radiation. She is taking Vicodin every 4-6 hours which has caused her to feel nauseated, lost her appetite, and is minimally controlling her pain. Her daughter who is a nurse is alternating this with Ultram. She requests something stronger for pain. The patient declines anything for nausea at this time. She denies any hemoptysis. He reports about a 8 pound weight loss. She denies any headaches. She complains of pain just between her mid shoulder blades. The patient was falling  asleep multiple times in her wheelchair during our conversation. The patient does not indicate a desire for chemotherapy and her family is a bit of a loss as to what they would like to pursue.  PREVIOUS RADIATION THERAPY: No  PAST MEDICAL HISTORY:  has a past medical history of COPD (chronic obstructive pulmonary disease); A-fib; Stroke; Dysrhythmia; Shortness of breath; Arthritis; Nausea; Cancer; and Macular degeneration.    PAST SURGICAL HISTORY: Past Surgical History  Procedure Laterality Date  . Cholecystectomy    . Abdominal hysterectomy    . Eye surgery      bil. cataracts with lens implants  . Thoracentesis  12/03/13    FAMILY HISTORY: No family history on file.  SOCIAL HISTORY:  History  Substance Use Topics  . Smoking status: Former Smoker -- 0.50 packs/day for 20 years    Types: Cigarettes    Quit date: 01/18/1979  . Smokeless tobacco: Never Used  . Alcohol Use: No    ALLERGIES: Kiwi extract; Pineapple; Shellfish allergy; and Latex  MEDICATIONS:  Current Outpatient Prescriptions  Medication Sig Dispense Refill  . acetaminophen (TYLENOL) 325 MG tablet Take 650 mg by mouth every 6 (six) hours as needed for mild pain or headache.      . albuterol (PROVENTIL HFA;VENTOLIN HFA) 108 (90 BASE) MCG/ACT inhaler Inhale 2 puffs into the lungs every 6 (six) hours as needed for wheezing or shortness of breath.  1 Inhaler  3  . atorvastatin (LIPITOR) 10 MG tablet Take 1 tablet (10 mg total) by mouth daily.  30 tablet  0  . donepezil (  ARICEPT) 10 MG tablet Take 1 tablet (10 mg total) by mouth at bedtime.  30 tablet  3  . folic acid (FOLVITE) 1 MG tablet Take 1 tablet (1 mg total) by mouth daily.  30 tablet  3  . gabapentin (NEURONTIN) 600 MG tablet Take 1 tablet (600 mg total) by mouth at bedtime.  30 tablet  3  . lidocaine (LIDODERM) 5 % Place 1 patch onto the skin daily as needed (Pain. Applied to back). Remove & Discard patch within 12 hours or as directed by MD      . lisinopril  (PRINIVIL,ZESTRIL) 10 MG tablet Take 1 tablet (10 mg total) by mouth daily.  30 tablet  3  . lisinopril (PRINIVIL,ZESTRIL) 10 MG tablet Take 10 mg by mouth daily.      Marland Kitchen LORazepam (ATIVAN) 0.5 MG tablet Take 0.5 mg by mouth every 8 (eight) hours as needed for anxiety or sleep.       . methotrexate (RHEUMATREX) 2.5 MG tablet Take 17.5 mg by mouth every Wednesday.       . metoprolol tartrate (LOPRESSOR) 25 MG tablet Take 0.5 tablets (12.5 mg total) by mouth 2 (two) times daily.  60 tablet  3  . NIFEdipine (PROCARDIA-XL/ADALAT CC) 30 MG 24 hr tablet Take 1 tablet (30 mg total) by mouth daily.  30 tablet  3  . ondansetron (ZOFRAN) 4 MG tablet Take 1 tablet (4 mg total) by mouth every 8 (eight) hours as needed for nausea or vomiting.  30 tablet  0  . ondansetron (ZOFRAN-ODT) 8 MG disintegrating tablet Take 8 mg by mouth every 8 (eight) hours as needed for nausea or vomiting.      Marland Kitchen oxyCODONE (OXY IR/ROXICODONE) 5 MG immediate release tablet Take 1 tablet (5 mg total) by mouth every 4 (four) hours as needed for severe pain.  60 tablet  0  . oxyCODONE-acetaminophen (PERCOCET/ROXICET) 5-325 MG per tablet Take 2 tablets by mouth every 4 (four) hours as needed for severe pain.  30 tablet  0  . prochlorperazine (COMPAZINE) 10 MG tablet Take 1 tablet (10 mg total) by mouth every 6 (six) hours as needed for nausea or vomiting.  30 tablet  0  . sertraline (ZOLOFT) 100 MG tablet Take 1 tablet (100 mg total) by mouth daily.  30 tablet  0  . Tiotropium Bromide Monohydrate (SPIRIVA RESPIMAT) 2.5 MCG/ACT AERS Inhale 2 puffs into the lungs daily.      . traMADol (ULTRAM) 50 MG tablet Take 1 tablet (50 mg total) by mouth every 8 (eight) hours as needed.  30 tablet  0  . warfarin (COUMADIN) 10 MG tablet Take 10 mg by mouth daily.       No current facility-administered medications for this encounter.    REVIEW OF SYSTEMS:  A 15 point review of systems is documented in the electronic medical record. This was obtained by  the nursing staff. However, I reviewed this with the patient to discuss relevant findings and make appropriate changes.  Pertinent items are noted in HPI.  PHYSICAL EXAM:  Filed Vitals:   12/11/13 1051  BP: 127/44  Pulse: 52  Temp: 97.8 F (36.6 C)   pleasant female in no distress sitting comfortably in a wheelchair. Nasal cannula in place. No normal respiratory effort. Somnolent. Alert and oriented x2.  LABORATORY DATA:  Lab Results  Component Value Date   WBC 9.4 11/19/2013   HGB 12.5 11/19/2013   HCT 39.3 11/19/2013   MCV 94.7 11/19/2013  PLT 335 11/19/2013   Lab Results  Component Value Date   NA 143 11/19/2013   K 4.7 11/19/2013   CL 103 11/19/2013   CO2 31 11/19/2013   No results found for this basename: ALT, AST, GGT, ALKPHOS, BILITOT     RADIOGRAPHY: Dg Chest 1 View  12/03/2013   CLINICAL DATA:  Evaluate for pneumothorax post left-sided thoracentesis  EXAM: CHEST - 1 VIEW  COMPARISON:  DG CHEST 2V dated 09/03/2011; US THORACENTESIS ASP PLEURAL SPACE W/IMG GUIDE dated 12/03/2013; CT ANGIO CHEST W/CM &/OR WO/CM dated 11/20/2013; NM PET IMAGE INITIAL (PI) SKULL BASE TO THIGH dated 11/28/2013  FINDINGS: Grossly unchanged cardiac silhouette and mediastinal contours with atherosclerotic plaque within the thoracic aorta. There is persistent obscuration of the superior aspect of the left hilum secondary to known left suprahilar mass. Interval decrease in residual small left-sided effusion post thoracentesis. No pneumothorax. Unchanged trace right-sided pleural effusion. The lungs remain hyperexpanded with diffuse nodular thickening of the pulmonary interstitium. Scattered bilateral discrete nodules within the bilateral lungs are also unchanged, right greater than left. Unchanged bones. Post cholecystectomy.  IMPRESSION: 1. Interval decrease in residual small left-sided effusion post thoracentesis. No pneumothorax. 2. Grossly unchanged findings of dominant left suprahilar mass and metastatic disease  involving the bilateral lungs as demonstrated on recently performed PET-CT.   Electronically Signed   By: Sandi Mariscal M.D.   On: 12/03/2013 12:06   Dg Chest 2 View  11/20/2013   CLINICAL DATA:  Shortness of breath.  EXAM: CHEST  2 VIEW  COMPARISON:  DG CHEST 1V dated 09/23/2013; DG CHEST 2V dated 09/15/2013; CT ANGIO CHEST W/CM &/OR WO/CM dated 01/18/2012  FINDINGS: Left suprahilar soft tissue mass versus infiltrate noted. Chest CT is suggested for further evaluation. Small left pleural effusion. No pneumothorax. Bilateral tiny questionable pulmonary nodules are present. These can be further evaluated with chest CT. Heart size normal. No focal bony abnormality identified. Prior cholecystectomy.  IMPRESSION: 1. Left suprahilar soft tissue mass versus infiltrate. This findings are worrisome for a tumor. Chest CT is suggested for further evaluation.  2. Cannot exclude tiny bilateral pulmonary nodules. This can also be evaluated with chest CT.  3. Small left pleural effusion.   Electronically Signed   By: Marcello Moores  Register   On: 11/20/2013 00:35   Dg Lumbar Spine Complete  11/20/2013   CLINICAL DATA:  Right lower back pain beginning 4 days ago.  EXAM: LUMBAR SPINE - COMPLETE 4+ VIEW  COMPARISON:  CT chest, abdomen and pelvis 07/07/2010.  FINDINGS: Vertebral body height and alignment are maintained. There is mild loss of disc space height at L4-5. No pars interarticularis defect is identified. There is some facet degenerative change at L4-5 and L5-S1. Atherosclerotic vascular disease is noted. Cholecystectomy clips are seen.  IMPRESSION: No acute finding.  Mild lower lumbar degenerative change.   Electronically Signed   By: Inge Rise M.D.   On: 11/20/2013 00:32   Ct Angio Chest Pe W/cm &/or Wo Cm  11/20/2013   CLINICAL DATA:  Abnormal chest x-ray with a possible mass in the left lung.  EXAM: CT ANGIOGRAPHY CHEST WITH CONTRAST  TECHNIQUE: Multidetector CT imaging of the chest was performed using the standard  protocol during bolus administration of intravenous contrast. Multiplanar CT image reconstructions and MIPs were obtained to evaluate the vascular anatomy.  CONTRAST:  80 mL OMNIPAQUE IOHEXOL 350 MG/ML SOLN  COMPARISON:  PA and lateral chest 11/20/2013 at 12:22 a.m. Single view of the chest 09/23/2013. CT  chest, abdomen and pelvis 09/15/2013.  FINDINGS: No pulmonary embolus is identified. Left upper lobe nodule which had measured 2.6 x 1.4 cm on the prior study today measures 4.3 by 2.6 cm on image 29 of series 7. There is new AP window lymphadenopathy with a node measuring 2.5 by 2.3 cm seen on image 34. This lymph node is contiguous with the patient's mass. New infrahilar lymph node on image 46 measures 1.2 cm. The patient has a new small left pleural effusion. There is no right pleural effusion or pericardial effusion. Extensive atherosclerotic vascular disease is unchanged in appearance with a penetrating ulcer of the descending thoracic aorta identified, unchanged. There is no axillary lymphadenopathy.  Lungs again demonstrate extensive centrilobular emphysematous change. There is a new right upper lobe nodule on image 18 measuring 0.5 cm in diameter. A second new right upper lobe nodule on image 20 measures 0.5 cm. A new left lower lobe nodule on image 36 measures 0.8 cm. Multiple additional subcentimeter pulmonary nodules are identified bilaterally.  Visualized upper abdomen is unremarkable. Bones demonstrate a destructive lesion in the posterior aspect of the T2 vertebral body which is not seen on the most recent CT. No other or destructive lesion is seen. No lytic lesion is identified.  Review of the MIP images confirms the above findings.  IMPRESSION: Negative for pulmonary embolus.  Marked increase in the size of a left upper lobe mass with new mediastinal and left hilar lymphadenopathy, bilateral pulmonary nodules and a destructive lesion in T2 all consistent with primary bronchogenic carcinoma and  metastatic disease.   Electronically Signed   By: Inge Rise M.D.   On: 11/20/2013 01:42   Nm Pet Image Initial (pi) Skull Base To Thigh  11/28/2013   CLINICAL DATA:  Initial treatment strategy for lung mass.  EXAM: NUCLEAR MEDICINE PET SKULL BASE TO THIGH  TECHNIQUE: 6.5 mCi F-18 FDG was injected intravenously. Full-ring PET imaging was performed from the skull base to thigh after the radiotracer. CT data was obtained and used for attenuation correction and anatomic localization.  FASTING BLOOD GLUCOSE:  Value: 118 mg/dl  COMPARISON:  CT ANGIO CHEST W/CM &/OR WO/CM dated 11/20/2013; CT ANGIO CHEST-ABD-PELV dated 09/15/2013  FINDINGS: NECK  No hypermetabolic lymph nodes in the neck.  CHEST  Enlarging left upper lobe suprahilar mass is intensely hypermetabolic with SUV max equal 9.9. The mass measures approximately 4.8 by 3.3 cm. There are bilateral small 1 cm pulmonary nodules which are hypermetabolic consistent with bilateral pulmonary metastasis. There are prevascular and paratracheal lymph nodes which are hypermetabolic consistent with mediastinal nodal metastasis.  There is a small left effusion which is increased compared to prior.  ABDOMEN/PELVIS  There is a focus of discrete hypermetabolic activity within the body of the pancreas with SUV max = 9.4. Lesion is difficult to define on the non contrast CT but is present on image 114. No liver metastasis evident. No adrenal metastasis.  SKELETON  There is widespread skeletal metastasis. There is a lytic lesion in the body of the T2 vertebral body with intense metabolic activity (SUV max 11.2). Additional lesion seen in the T5 vertebral body. There are hypermetabolic lesions within the the pelvis and sacrum. For example lesion in the posterior right iliac bone measuring 2.2 cm with SUV max 15.5. Additional smaller metastatic lesions scattered throughout the spine and sternum.  IMPRESSION: 1. Hypermetabolic left suprahilar mass most consistent with primary  bronchogenic carcinoma. 2. Bilateral hypermetabolic small pulmonary nodules consistent with pulmonary metastasis. 3.  Hypermetabolic mediastinal nodal metastasis. 4. Single metastasis to the body of the pancreas. 5. Widespread skeletal metastasis which are hypermetabolic.   Electronically Signed   By: Suzy Bouchard M.D.   On: 11/28/2013 09:04   US Thoracentesis Asp Pleural Space W/img Guide  12/03/2013   CLINICAL DATA:  Left lung mass, left pleural effusion, request for thoracentesis.  EXAM: ULTRASOUND GUIDED diagnostic and therapeutic THORACENTESIS  COMPARISON:  None.  PROCEDURE: An ultrasound guided thoracentesis was thoroughly discussed with the patient and questions answered. The benefits, risks, alternatives and complications were also discussed. The patient understands and wishes to proceed with the procedure. Written consent was obtained.  Ultrasound was performed to localize and mark an adequate pocket of fluid in the left chest. The area was then prepped and draped in the normal sterile fashion. 1% Lidocaine was used for local anesthesia. Under ultrasound guidance a 19 gauge Yueh catheter was introduced. Thoracentesis was performed. The catheter was removed and a dressing applied. Left pleural fluid remains, procedure stopped secondary to patient's request given pain.  Complications:  None.  FINDINGS: A total of approximately 150 ml of yellow fluid was removed. A fluid sample was sent for laboratory analysis.  IMPRESSION: Successful ultrasound guided left thoracentesis yielding 150 ml of pleural fluid.  Read By:  Tsosie Billing PA-C   Electronically Signed   By: Sandi Mariscal M.D.   On: 12/03/2013 12:28      IMPRESSION: 80 year old female with likely metastatic non-small cell lung cancer metastatic to bone  PLAN: I spoke with the family today about the options in terms of radiation treatment for palliation of her spine pain. We discussed the process of simulation the placement tattoos. We discussed 2  weeks of treatment as an outpatient. I do not feel comfortable given the fact that this will be over her cervical upper thoracic esophagus in giving her hypofractionation do too high fraction size and the increased effects she may see. We discussed esophagitis as a possible side effect of treatment. The hope is that she can come off narcotics which is clearly decreasing her quality of life. Her daughter states that despite the somnolence she is experiencing currently the Vicodin actually doesn't relieve her pain to meaningful amount. She requested something stronger and I have given her prescription for oxycodone as well as instructions to take daily Senokot to prevent constipation. We will plan on simulating her on Monday. I think it would be helpful from a symptom management and goals of care perspective for her to see palliative care and I have made that referral. She will see palliative care on Monday morning 8:00 followed by simulation and 9. I will plan on treating a very small field to just the upper thoracic area where she is having pain rather than trying to go encompass her chest disease as I feel that will increase her morbidity. She's RD having a difficult time with swallowing and nutrition notable only for a higher risk for significant complications. Her son signed informed consent for her. We'll proceed forward simulation on Monday.  I spent 60 minutes  face to face with the patient and more than 50% of that time was spent in counseling and/or coordination of care.   ------------------------------------------------  Thea Silversmith, MD

## 2013-12-11 NOTE — Addendum Note (Signed)
Encounter addended by: Arlyss Repress, RN on: 12/11/2013  5:08 PM<BR>     Documentation filed: Charges VN

## 2013-12-15 ENCOUNTER — Ambulatory Visit
Admission: RE | Admit: 2013-12-15 | Discharge: 2013-12-15 | Disposition: A | Discharge status: Home / Self Care | Payer: Medicare Other | Source: Ambulatory Visit | Attending: Radiation Oncology | Admitting: Radiation Oncology

## 2013-12-15 DIAGNOSIS — J449 Chronic obstructive pulmonary disease, unspecified: Secondary | ICD-10-CM

## 2013-12-15 DIAGNOSIS — C3490 Malignant neoplasm of unspecified part of unspecified bronchus or lung: Secondary | ICD-10-CM

## 2013-12-15 DIAGNOSIS — Z515 Encounter for palliative care: Secondary | ICD-10-CM

## 2013-12-15 DIAGNOSIS — I4891 Unspecified atrial fibrillation: Secondary | ICD-10-CM

## 2013-12-15 NOTE — Consult Note (Signed)
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 Patient was admitted at the end of January to [facility] for respiratory distress. Daughter, who is an Therapist, sports, reports she was treated with IV abx and had a CT scan that was positive for a lung mass. She was discharged on PRN oxygen.  Daughter notes an approximate 5-8 lb weight loss over the last year. Patient was very active up till 01/2013 (CVA),  ER evaluation included a chest and lumbar spine xray. CXR demonstrated a left suprahilar soft tissue mass. Lumbar film was negative for acute findings. Further assessment with CTA of Chest was negative for PE, but noted LUL mass with new mediastinal & L hilar lymphadenopathy, bilateral pulmonary nodules & a destructive lesion at T2 consistent with primary bronchogenic carcinoma with metastatic disease.   PET scan Hypermetabolic left suprahilar mass , Bilateral hypermetabolic small pulmonary nodules , Hypermetabolic mediastinal nodes, Single metastasis to the body of the pancreas. Widespread skeletal metastasis  US guided thoracentesis yielded 150 cc of exudative fluid-atypical cells on cytology.   Per family continued physical, functional decline.  Poorly controlled back pain.  Consult is for introduction to concept of Palliative Medicine, symptom recommendation and discussion of advanced directives and anticipatory care needs.  This NP Wadie Lessen  reviewed medical records, received report from team, assessed the patient and then meet in the [place] OP clinic along with her daughter Albertha Ghee and her son Hailee Hollick, patient is here today for simulation for planned palliative radiation.  A detailed discussion was had today regarding advanced directives.  Concepts specific to code status, artifical feeding and hydration, continued IV antibiotics and rehospitalization was had.  The difference between a aggressive medical intervention path  and a palliative comfort care path for this patient at this time was had.  Values and goals of care important to patient and family were attempted to be elicited.  Concept of Hospice and Palliative Care were discussed  Questions and concerns addressed.  Hard Choices booklet left for review.  MOST form reviewed  Family encouraged to call with questions or concerns.  PMT will continue to support holistically.   Goals of Care: 1.  Code Status:  Full code at this time   2. Scope of Treatment:  At this time patient and family are processing the medical situation and making decisions keeping comfort as the priority.  We discussed the risks and benefits of various palliative treatments specific to anticipated radiation. We discussed the importance in understanding the intent of various medical interventions    3. Disposition:    Family expressed that presently care needs are great at home.  Both the patient and her husband (with dementia) require much assistance.  Hospice services were discussed in detail.   4. Symptom Management:  1. Pain:  Family tell me she is presently on Oxycodone 5 mg every  4 hrs prn, but that it is only holding for 2-3 hrs.        I believe a long acting agent, with prn for breakthrough will benefit this patient.   We discussed the importance of close f/u and adjustment for quality pain control.  I contacted [facility] and they plan to see her on Wednesday this week, in  her home for symptom management under their Pallitive program.  They will also be albe to transition her to her hospice benefit when family is ready.  2. Bowel Regimen:  Discussed issue of constipation with opiod use, encouraged Senna-s two tablets qhs prn  3.  Weakness/Fatigue/Failure to thrive: medical management of treatable illness, continued goal setting  5. Psychosocial:  Emotional support to patient and her two children.  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 Patient was admitted at the end of January to [facility] for respiratory distress. Daughter, who is an Therapist, sports, reports she was treated with IV abx and had a CT scan that was positive for a lung mass. She was discharged on PRN oxygen. Patient was brought back for a bronchoscopy ([facility])with reported mucus plugging, no endobronchial lesions. Daughter notes an approximate 5-8 lb weight loss over the last year. 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 Currently, she does not drive due to short term memory but is independent of all ADL's otherwise.  ER evaluation included a chest and lumbar spine xray. CXR demonstrated a left suprahilar soft tissue mass. Lumbar film was negative for acute findings. Further assessment with CTA of Chest was negative for PE, but noted LUL mass with new mediastinal & L hilar lymphadenopathy, bilateral pulmonary nodules & a destructive lesion at T2 consistent with primary bronchogenic carcinoma  with metastatic disease.   PET scan Hypermetabolic left suprahilar mass , Bilateral hypermetabolic small pulmonary nodules , Hypermetabolic mediastinal nodes, Single metastasis to the body of the pancreas. Widespread skeletal metastasis  US guided thoracentesis yielded 150 cc of exudative fluid-atypical cells on cytology.  She has new home oxygen since Jan admit at [facility]  Complains of pain in her upper back  Percocet makes her sleepy, her daughter gave her Ultram at some relief, c/o nausea x1 day    ROS:  Weakness, poor appetite, back pain   PMH:  Past Medical History  Diagnosis Date  . COPD (chronic obstructive pulmonary disease)   . A-fib   . Stroke     memory loss  . Dysrhythmia     A-Fibrillation  . Shortness of breath   . Arthritis   . Nausea   . Cancer     lung,pancreas, bone-new diagnosis  . Macular degeneration      PSH: Past Surgical History  Procedure Laterality Date  . Cholecystectomy    . Abdominal hysterectomy    . Eye surgery      bil. cataracts with lens implants  .  Thoracentesis  12/03/13   I have reviewed the FH and SH and  If appropriate update it with new information. Allergies  Allergen Reactions  . Kiwi Extract   . Pineapple   . Shellfish Allergy Other (See Comments)    Family is unsure  . Latex Rash   Scheduled Meds: Continuous Infusions: PRN Meds:.    There were no vitals taken for this visit.   PPS: 40 %  No intake or output data in the 24 hours ending 12/15/13 0922                       Stool Softener  Senna-s two                                                                                                                                                 tablets qhs prn  Physical Exam:  General: frail, weak elderly white female NAD HEENT: mm, no exudate Skin: warm and dry  Labs: CBC    Component Value Date/Time   WBC 9.4 11/19/2013 2315   RBC 4.15 11/19/2013 2315   HGB 12.5 11/19/2013 2315   HCT 39.3 11/19/2013 2315   PLT 335  11/19/2013 2315   MCV 94.7 11/19/2013 2315   MCH 30.1 11/19/2013 2315   MCHC 31.8 11/19/2013 2315   RDW 15.7* 11/19/2013 2315   LYMPHSABS 1.6 11/19/2013 2315   MONOABS 1.1* 11/19/2013 2315   EOSABS 0.8* 11/19/2013 2315   BASOSABS 0.0 11/19/2013 2315    BMET    Component Value Date/Time   NA 143 11/19/2013 2315   K 4.7 11/19/2013 2315   CL 103 11/19/2013 2315   CO2 31 11/19/2013 2315   GLUCOSE 101* 11/19/2013 2315   BUN 20 11/19/2013 2315   CREATININE 0.80 11/19/2013 2315   CALCIUM 9.3 11/19/2013 2315   GFRNONAA 67* 11/19/2013 2315   GFRAA 77* 11/19/2013 2315    CMP     Component Value Date/Time   NA 143 11/19/2013 2315   K 4.7 11/19/2013 2315   CL 103 11/19/2013 2315   CO2 31 11/19/2013 2315   GLUCOSE 101* 11/19/2013 2315   BUN 20 11/19/2013 2315   CREATININE 0.80 11/19/2013 2315   CALCIUM 9.3 11/19/2013 2315   GFRNONAA 67* 11/19/2013 2315   GFRAA 77* 11/19/2013 2315   ECOG PERFORMANCE STATUS* (Eastern Cooperative Oncology Group)  0 Fully active, able to continue with all pre-disease activities without restriction. Pt score  1 Restricted in physically strenuous activity but ambulatory and able to carry out work of a light or sedentary nature, e.g., light house work, office work.   2 Ambulatory and capable of all self-care but unable to carry out any work activities. Up and about more than 50% of waking hours.    3 Capable of only limited self-care. Confined to bed or  chair more than 50% of waking hours. 3  4 Completely disabled. Cannot carry on any self-care. Totally confined to bed or chair.   5 Dead.    As published in Am. J. Clin. Oncol.: Eustace Pen, M.M., Colon Flattery., [place], D.C., Horton, Sharen Hint., Drexel Iha, P.P.: Toxicity And Response Criteria Of The [facility]. [place] 5:872-761, 1982.  The ECOG Performance Status is in the public domain therefore available for public use. To duplicate the scale, please cite the reference above and credit  the [facility], Tyler Pita M.D., Group Chair    Time In Time Out Total Time Spent with Patient Total Overall Time  0800 0915 70 min 75 min    Greater than 50%  of this time was spent counseling and coordinating care related to the above assessment and plan.   Wadie Lessen NP  Palliative Medicine Team Team Phone # [phone] Pager [phone]  Discussed with [facility] for Palliative in home services

## 2013-12-18 ENCOUNTER — Ambulatory Visit
Admission: RE | Admit: 2013-12-18 | Discharge: 2013-12-18 | Disposition: A | Discharge status: Home / Self Care | Payer: Medicare Other | Source: Ambulatory Visit | Attending: Radiation Oncology | Admitting: Radiation Oncology

## 2013-12-18 DIAGNOSIS — C3490 Malignant neoplasm of unspecified part of unspecified bronchus or lung: Secondary | ICD-10-CM

## 2013-12-18 DIAGNOSIS — C7951 Secondary malignant neoplasm of bone: Secondary | ICD-10-CM

## 2013-12-18 MED ORDER — RADIAPLEXRX EX GEL
Freq: Once | CUTANEOUS | Status: AC
  Administered 2013-12-18: 19:00:00 via TOPICAL

## 2013-12-18 NOTE — Progress Notes (Signed)
  Radiation Oncology         [phone]) [phone] ________________________________  Name: Karen Adams MRN: [id number]  Date: 12/18/2013  DOB: May 24, 1929  Simulation Verification Note  Status: outpatient  NARRATIVE: The patient was brought to the treatment unit and placed in the planned treatment position. The clinical setup was verified. Then port films were obtained and uploaded to the radiation oncology medical record software.  The treatment beams were carefully compared against the planned radiation fields. The position location and shape of the radiation fields was reviewed. They targeted volume of tissue appears to be appropriately covered by the radiation beams. Organs at risk appear to be excluded as planned.  Based on my personal review, I approved the simulation verification. The patient's treatment will proceed as planned.  -----------------------------------  Blair Promise, PhD, MD

## 2013-12-19 ENCOUNTER — Ambulatory Visit
Admission: RE | Admit: 2013-12-19 | Discharge: 2013-12-19 | Disposition: A | Discharge status: Home / Self Care | Payer: Medicare Other | Source: Ambulatory Visit | Attending: Radiation Oncology | Admitting: Radiation Oncology

## 2013-12-19 ENCOUNTER — Ambulatory Visit: Payer: Medicare Other

## 2013-12-22 ENCOUNTER — Ambulatory Visit
Admission: RE | Admit: 2013-12-22 | Discharge: 2013-12-22 | Disposition: A | Discharge status: Home / Self Care | Payer: Medicare Other | Source: Ambulatory Visit | Attending: Radiation Oncology | Admitting: Radiation Oncology

## 2013-12-22 NOTE — Consult Note (Signed)
I have reviewed and discussed the care of this patient in detail with the nurse practitioner including pertinent patient records, physical exam findings and data. I agree with details of this encounter.  

## 2013-12-23 ENCOUNTER — Ambulatory Visit
Admission: RE | Admit: 2013-12-23 | Discharge: 2013-12-23 | Disposition: A | Discharge status: Home / Self Care | Payer: Medicare Other | Source: Ambulatory Visit | Attending: Radiation Oncology | Admitting: Radiation Oncology

## 2013-12-23 ENCOUNTER — Ambulatory Visit: Payer: Medicare Other | Admitting: Radiation Oncology

## 2013-12-24 ENCOUNTER — Ambulatory Visit: Payer: Medicare Other | Admitting: Radiation Oncology

## 2013-12-24 ENCOUNTER — Ambulatory Visit
Admission: RE | Admit: 2013-12-24 | Discharge: 2013-12-24 | Disposition: A | Discharge status: Home / Self Care | Payer: Medicare Other | Source: Ambulatory Visit | Attending: Radiation Oncology | Admitting: Radiation Oncology

## 2013-12-24 NOTE — Progress Notes (Signed)
Spoke with patient and her grandson and informed him to tell mother that patient will be seen on Thursday by Dr.Wentworth as she will not be here at treatment time today.aalso informed Anderson Malta on linac 3.

## 2013-12-25 ENCOUNTER — Ambulatory Visit
Admission: RE | Admit: 2013-12-25 | Discharge: 2013-12-25 | Disposition: A | Discharge status: Home / Self Care | Payer: Medicare Other | Source: Ambulatory Visit | Attending: Radiation Oncology | Admitting: Radiation Oncology

## 2013-12-25 ENCOUNTER — Encounter: Payer: Self-pay | Admitting: Radiation Oncology

## 2013-12-25 VITALS — BP 150/62 | HR 71 | Temp 97.5°F | Ht 60.0 in | Wt 117.3 lb

## 2013-12-25 DIAGNOSIS — C3490 Malignant neoplasm of unspecified part of unspecified bronchus or lung: Secondary | ICD-10-CM

## 2013-12-25 NOTE — Progress Notes (Signed)
Karen Adams has received 6 fractions to her T-spine.  She denies any pain presently.  C/o feeling sleepy, and her family states she stays up all night.  Note increased edema of ankles and her family reports that she is more SOB and using her nebulizer more frequently.  O2 via [place] a 2 liters/min.  O2 sat 100%. Traveling by wheelchair

## 2013-12-25 NOTE — Progress Notes (Signed)
Weekly Management Note Current Dose: 18  Gy  Projected Dose: 30 Gy   Narrative:  The patient presents for routine under treatment assessment.  CBCT/MVCT images/Port film x-rays were reviewed.  The chart was checked.Doing well. Pain continues. Pain under bilateral breasts worse at night. On oxycontin and oxycodone. Up at night since changing from MTX to dexamethasone. Family would like to see Dr. Julien Nordmann. Followed at home by Palliative Care nurse NOT HOSPICE  Physical Findings: Weight: 117 lb 4.8 oz (53.207 kg). Unchanged. Nasal cannula in place.  Impression:  The patient is tolerating radiation.  Plan:  Continue treatment as planned. Continue RT.  Will set up appt with medical oncology at daughter's request although I am not sure they really want chemotherapy or that she will be a candidate.

## 2013-12-26 ENCOUNTER — Ambulatory Visit
Admission: RE | Admit: 2013-12-26 | Discharge: 2013-12-26 | Disposition: A | Discharge status: Home / Self Care | Payer: Medicare Other | Source: Ambulatory Visit | Attending: Radiation Oncology | Admitting: Radiation Oncology

## 2013-12-26 ENCOUNTER — Telehealth: Payer: Self-pay | Admitting: *Deleted

## 2013-12-26 NOTE — Telephone Encounter (Signed)
Called regarding appt to see Dr. Julien Nordmann. I spoke with grandaughter.  Pt will be seen 01/01/14 at 3:45 with Dr. Julien Nordmann.

## 2013-12-29 ENCOUNTER — Ambulatory Visit
Admission: RE | Admit: 2013-12-29 | Discharge: 2013-12-29 | Disposition: A | Discharge status: Home / Self Care | Payer: Medicare Other | Source: Ambulatory Visit | Attending: Radiation Oncology | Admitting: Radiation Oncology

## 2013-12-30 ENCOUNTER — Encounter: Payer: Self-pay | Admitting: Radiation Oncology

## 2013-12-30 ENCOUNTER — Ambulatory Visit
Admission: RE | Admit: 2013-12-30 | Discharge: 2013-12-30 | Disposition: A | Discharge status: Home / Self Care | Payer: Medicare Other | Source: Ambulatory Visit | Attending: Radiation Oncology | Admitting: Radiation Oncology

## 2013-12-30 VITALS — BP 137/42 | HR 62 | Resp 16 | Wt 117.6 lb

## 2013-12-30 DIAGNOSIS — C7951 Secondary malignant neoplasm of bone: Secondary | ICD-10-CM

## 2013-12-30 NOTE — Progress Notes (Signed)
Weekly Management Note Current Dose:27 Gy  Projected Dose:30 Gy   Narrative:  The patient presents for routine under treatment assessment.  CBCT/MVCT images/Port film x-rays were reviewed.  The chart was checked. Pain "about the same" per patient. Per grandson patient is sleeping better now.   Physical Findings:  Patient is sleepy in a wheelchair. BNormal respiratory effort.   Vitals:  Filed Vitals:   12/30/13 1746  BP: 137/42  Pulse: 62  Resp: 16   Weight:  Wt Readings from Last 3 Encounters:  12/30/13 117 lb 9.6 oz (53.343 kg)  12/25/13 117 lb 4.8 oz (53.207 kg)  12/04/13 121 lb (54.885 kg)   Lab Results  Component Value Date   WBC 9.4 11/19/2013   HGB 12.5 11/19/2013   HCT 39.3 11/19/2013   MCV 94.7 11/19/2013   PLT 335 11/19/2013   Lab Results  Component Value Date   CREATININE 0.80 11/19/2013   BUN 20 11/19/2013   NA 143 11/19/2013   K 4.7 11/19/2013   CL 103 11/19/2013   CO2 31 11/19/2013     Impression:  The patient is tolerating radiation.  Plan:  Continue treatment as planned. Finishes tomorrow. Appt with medical oncology tomorrow afternoon given to patient and grandson. Follow up with me in 1 month.

## 2013-12-30 NOTE — Progress Notes (Signed)
Continuous oxygen therapy 3 liters via nasal cannula noted. Patient sitting in wheelchair but, able to stand on scale for weight with two person assist. Weight stable. Patient reports that she has a good appetite despite occasional nausea. Patient drowsy today but, reports sleeping without difficulty. Denies any pain at this time. Denies numbness or tingling to extremities. Pitting plus 2 of bilateral lower extremities noted.

## 2013-12-31 ENCOUNTER — Encounter: Payer: Self-pay | Admitting: Radiation Oncology

## 2013-12-31 ENCOUNTER — Ambulatory Visit
Admission: RE | Admit: 2013-12-31 | Discharge: 2013-12-31 | Disposition: A | Discharge status: Home / Self Care | Payer: Medicare Other | Source: Ambulatory Visit | Attending: Radiation Oncology | Admitting: Radiation Oncology

## 2014-01-01 ENCOUNTER — Encounter: Payer: Self-pay | Admitting: Internal Medicine

## 2014-01-01 ENCOUNTER — Encounter: Payer: Self-pay | Admitting: *Deleted

## 2014-01-01 ENCOUNTER — Ambulatory Visit: Payer: Medicare Other | Attending: Internal Medicine | Admitting: Physical Therapy

## 2014-01-01 ENCOUNTER — Ambulatory Visit: Payer: Medicare Other

## 2014-01-01 ENCOUNTER — Telehealth: Payer: Self-pay | Admitting: *Deleted

## 2014-01-01 ENCOUNTER — Telehealth: Payer: Self-pay | Admitting: Internal Medicine

## 2014-01-01 ENCOUNTER — Ambulatory Visit (HOSPITAL_BASED_OUTPATIENT_CLINIC_OR_DEPARTMENT_OTHER): Payer: Medicare Other | Admitting: Internal Medicine

## 2014-01-01 ENCOUNTER — Institutional Professional Consult (permissible substitution) (INDEPENDENT_AMBULATORY_CARE_PROVIDER_SITE_OTHER): Payer: Medicare Other | Admitting: Thoracic Surgery (Cardiothoracic Vascular Surgery)

## 2014-01-01 VITALS — BP 125/57 | HR 76 | Temp 98.0°F | Resp 18 | Wt 121.3 lb

## 2014-01-01 VITALS — BP 125/57 | HR 76 | Temp 98.0°F | Resp 18 | Ht 60.0 in | Wt 121.3 lb

## 2014-01-01 DIAGNOSIS — C3490 Malignant neoplasm of unspecified part of unspecified bronchus or lung: Secondary | ICD-10-CM | POA: Diagnosis not present

## 2014-01-01 DIAGNOSIS — Z87891 Personal history of nicotine dependence: Secondary | ICD-10-CM

## 2014-01-01 DIAGNOSIS — K8689 Other specified diseases of pancreas: Secondary | ICD-10-CM

## 2014-01-01 DIAGNOSIS — IMO0001 Reserved for inherently not codable concepts without codable children: Secondary | ICD-10-CM | POA: Diagnosis not present

## 2014-01-01 DIAGNOSIS — M949 Disorder of cartilage, unspecified: Secondary | ICD-10-CM

## 2014-01-01 DIAGNOSIS — R0602 Shortness of breath: Secondary | ICD-10-CM | POA: Insufficient documentation

## 2014-01-01 DIAGNOSIS — I4891 Unspecified atrial fibrillation: Secondary | ICD-10-CM

## 2014-01-01 DIAGNOSIS — R918 Other nonspecific abnormal finding of lung field: Secondary | ICD-10-CM

## 2014-01-01 DIAGNOSIS — I639 Cerebral infarction, unspecified: Secondary | ICD-10-CM | POA: Insufficient documentation

## 2014-01-01 DIAGNOSIS — M899 Disorder of bone, unspecified: Secondary | ICD-10-CM

## 2014-01-01 DIAGNOSIS — F0390 Unspecified dementia without behavioral disturbance: Secondary | ICD-10-CM

## 2014-01-01 DIAGNOSIS — R42 Dizziness and giddiness: Secondary | ICD-10-CM | POA: Insufficient documentation

## 2014-01-01 DIAGNOSIS — J449 Chronic obstructive pulmonary disease, unspecified: Secondary | ICD-10-CM

## 2014-01-01 DIAGNOSIS — I6350 Cerebral infarction due to unspecified occlusion or stenosis of unspecified cerebral artery: Secondary | ICD-10-CM

## 2014-01-01 NOTE — Progress Notes (Signed)
PCP is Truitt Merle, NP Referring Provider is Burtis Junes, NP  No chief complaint on file.   HPI: 80 yo woman with a cc/o shortness of breath.  80 yo woman with multiple medical problems who recently was found to have a lung mass. She was admitted to [facility] in January with respiratory distress. A CT showed a lung mass, but bronchoscopy was nondiagnostic. She then developed back pain. W/u including a CT angio and PET showed stage IV lung cancer. She had a thoracentesis that showed atypical cells but no definitive cancer. Palliative radation was started and has helped her pain. She had been scheduled for a bronch but it was cancelled by the family because she was not doing well. She has improved and is now eating. She is mostly confined to a chair but can walk around her house some. She has gained about 25 pounds over the past 8 weeks.   She is now referred to Dr. Julien Nordmann and is interested in chemo but needs a tissue diagnosis in order to give it.  Past Medical History  Diagnosis Date  . COPD (chronic obstructive pulmonary disease)   . A-fib   . Stroke     memory loss  . Dysrhythmia     A-Fibrillation  . Shortness of breath   . Arthritis   . Nausea   . Cancer     lung,pancreas, bone-new diagnosis  . Macular degeneration     Past Surgical History  Procedure Laterality Date  . Cholecystectomy    . Abdominal hysterectomy    . Eye surgery      bil. cataracts with lens implants  . Thoracentesis  12/03/13    No family history on file.  Social History History  Substance Use Topics  . Smoking status: Former Smoker -- 0.50 packs/day for 20 years    Types: Cigarettes    Quit date: 01/18/1979  . Smokeless tobacco: Never Used  . Alcohol Use: No    Current Outpatient Prescriptions  Medication Sig Dispense Refill  . acetaminophen (TYLENOL) 325 MG tablet Take 650 mg by mouth every 6 (six) hours as needed for mild pain or headache.      . albuterol (PROVENTIL HFA;VENTOLIN HFA)  108 (90 BASE) MCG/ACT inhaler Inhale 2 puffs into the lungs every 6 (six) hours as needed for wheezing or shortness of breath.  1 Inhaler  3  . atorvastatin (LIPITOR) 10 MG tablet Take 1 tablet (10 mg total) by mouth daily.  30 tablet  0  . dexamethasone (DECADRON) 4 MG tablet       . donepezil (ARICEPT) 10 MG tablet Take 1 tablet (10 mg total) by mouth at bedtime.  30 tablet  3  . folic acid (FOLVITE) 1 MG tablet Take 1 tablet (1 mg total) by mouth daily.  30 tablet  3  . gabapentin (NEURONTIN) 600 MG tablet Take 1 tablet (600 mg total) by mouth at bedtime.  30 tablet  3  . hyaluronate sodium (RADIAPLEXRX) GEL Apply 1 application topically 2 (two) times daily. Apply to affected area after radiation treatments      . lidocaine (LIDODERM) 5 % Place 1 patch onto the skin daily as needed (Pain. Applied to back). Remove & Discard patch within 12 hours or as directed by MD      . lisinopril (PRINIVIL,ZESTRIL) 10 MG tablet Take 1 tablet (10 mg total) by mouth daily.  30 tablet  3  . LORazepam (ATIVAN) 0.5 MG tablet Take 0.5 mg by  mouth every 8 (eight) hours as needed for anxiety or sleep.       . metoprolol tartrate (LOPRESSOR) 25 MG tablet Take 0.5 tablets (12.5 mg total) by mouth 2 (two) times daily.  60 tablet  3  . NIFEdipine (PROCARDIA-XL/ADALAT CC) 30 MG 24 hr tablet Take 1 tablet (30 mg total) by mouth daily.  30 tablet  3  . ondansetron (ZOFRAN) 4 MG tablet Take 1 tablet (4 mg total) by mouth every 8 (eight) hours as needed for nausea or vomiting.  30 tablet  0  . oxycodone (OXY-IR) 5 MG capsule Take 10 mg by mouth every 4 (four) hours as needed.      . OxyCODONE (OXYCONTIN) 10 mg T12A 12 hr tablet Take 10 mg by mouth every 12 (twelve) hours.      . pantoprazole (PROTONIX) 40 MG tablet       . prochlorperazine (COMPAZINE) 10 MG tablet Take 1 tablet (10 mg total) by mouth every 6 (six) hours as needed for nausea or vomiting.  30 tablet  0  . sertraline (ZOLOFT) 100 MG tablet Take 1 tablet (100 mg  total) by mouth daily.  30 tablet  0  . Tiotropium Bromide Monohydrate (SPIRIVA RESPIMAT) 2.5 MCG/ACT AERS Inhale 2 puffs into the lungs daily.      . traMADol (ULTRAM) 50 MG tablet Take 1 tablet (50 mg total) by mouth every 8 (eight) hours as needed.  30 tablet  0   No current facility-administered medications for this visit.    Allergies  Allergen Reactions  . Kiwi Extract   . Pineapple   . Shellfish Allergy Other (See Comments)    Family is unsure  . Latex Rash    Review of Systems  Constitutional: Positive for activity change and unexpected weight change (has gained 25 pounds in last 2 months).  Respiratory: Positive for cough and shortness of breath.        Home O2  Musculoskeletal: Positive for back pain.  Neurological:       Memory problems  Hematological: Bruises/bleeds easily.    BP 125/57  Pulse 76  Temp(Src) 98 F (36.7 C)  Resp 18  Wt 121 lb 4.8 oz (55.021 kg)  SpO2 98% Physical Exam  Vitals reviewed. Constitutional: No distress.  Elderly, frail  HENT:  Head: Normocephalic and atraumatic.  Wearing O2  Eyes: EOM are normal. Pupils are equal, round, and reactive to light.  Neck: No tracheal deviation present.  Cardiovascular:  Irregularly irregular  Pulmonary/Chest: She has no wheezes.  Diminished BS bilaterally  Abdominal: Soft.  Musculoskeletal: She exhibits edema (2+).  Lymphadenopathy:    She has no cervical adenopathy.  Neurological: She is alert.  Very little speech  Skin: Skin is warm and dry.     Diagnostic Tests: NUCLEAR MEDICINE PET SKULL BASE TO THIGH  TECHNIQUE:  6.5 mCi F-18 FDG was injected intravenously. Full-ring PET imaging  was performed from the skull base to thigh after the radiotracer. CT  data was obtained and used for attenuation correction and anatomic  localization.  FASTING BLOOD GLUCOSE: Value: 118 mg/dl  COMPARISON: CT ANGIO CHEST W/CM &/OR WO/CM dated 11/20/2013; CT  ANGIO CHEST-ABD-PELV dated 09/15/2013  FINDINGS:   NECK  No hypermetabolic lymph nodes in the neck.  CHEST  Enlarging left upper lobe suprahilar mass is intensely  hypermetabolic with SUV max equal 9.9. The mass measures  approximately 4.8 by 3.3 cm. There are bilateral small 1 cm  pulmonary nodules which are hypermetabolic  consistent with bilateral  pulmonary metastasis. There are prevascular and paratracheal lymph  nodes which are hypermetabolic consistent with mediastinal nodal  metastasis.  There is a small left effusion which is increased compared to prior.  ABDOMEN/PELVIS  There is a focus of discrete hypermetabolic activity within the body  of the pancreas with SUV max = 9.4. Lesion is difficult to define on  the non contrast CT but is present on image 114. No liver metastasis  evident. No adrenal metastasis.  SKELETON  There is widespread skeletal metastasis. There is a lytic lesion in  the body of the T2 vertebral body with intense metabolic activity  (SUV max 11.2). Additional lesion seen in the T5 vertebral body.  There are hypermetabolic lesions within the the pelvis and sacrum.  For example lesion in the posterior right iliac bone measuring 2.2  cm with SUV max 15.5. Additional smaller metastatic lesions  scattered throughout the spine and sternum.  IMPRESSION:  1. Hypermetabolic left suprahilar mass most consistent with primary  bronchogenic carcinoma.  2. Bilateral hypermetabolic small pulmonary nodules consistent with  pulmonary metastasis.  3. Hypermetabolic mediastinal nodal metastasis.  4. Single metastasis to the body of the pancreas.  5. Widespread skeletal metastasis which are hypermetabolic.  Electronically Signed  By: Suzy Bouchard M.D.  On: 11/28/2013 09:04   Impression: 80 yo woman with stage IV cancer who needs a tissue diagnosis and tissue for molecular testing to be treated with palliative chemo.   I offered to do bronchoscopy and EBUS to try to provide adequate diagnostic material. I described  the procedure to the patient who I don't think understands, as well as her daughter. I discussed the need for general anesthesia, the general nature of the procedure and the outpatient nature of the procedure. I do think she is high risk for any type of procedure due to her frailty and poor functional status.   We discussed the indications, risks, benefits, and alternatives. They understand this is diagnostic only. They understand ~ 90% chance of successful diagnosis. They understand risks include but are not limited to death, MI, Stroke, bleeding, pneumothorax as well as the possibility of unforeseeable complications.  Her daughter needs to discuss with her siblings and will call if they want to proceed

## 2014-01-01 NOTE — Telephone Encounter (Signed)
Called pt to update regarding appt for [place].  She verbalized understanding.

## 2014-01-01 NOTE — Progress Notes (Signed)
   Thoracic Treatment Summary Name:Kandise Offner Date:01/01/2014 DOB:Dec 18, 1928 Your Medical Team Medical Oncologist: Dr. Julien Nordmann  Radiation Oncologist: Dr. Pablo Ledger Pulmonologist: Dr. Elsworth Soho Surgeon: Dr. Roxan Hockey Type and Stage of Lung Cancer Non-Small Cell Carcinoma or Small Cell Pathology atypical cells Clinical Stage: IV   Clinical stage is based on radiology exams.  Pathological stage will be determined after surgery.  Staging is based on the size of the tumor, involvement of lymph nodes or not, and whether or not the cancer center has spread. Recommendations Recommendations: Recommendation are for additional biopsy for further tissue analysis, chemotherapy and radiation   These recommendations are based on information available as of today's consult.  This is subject to change depending further testing or exams. Next Steps Next Step: 1. Tissue diagnosis thoracic surgery office will set up appointment [phone] 2. Medical Oncology will set up appointments for chemotherapy appointments [phone] 3.Radiation Oncology will set up appointments for radiation therapy [phone]  Barriers to Care What do you perceive as a potential barrier that may prevent you from receiving your treatment plan? Nothing perceived at this time Resources given: NCI booklet on lung cancer  Support/Resources information at [place].Radonna Ricker [phone] Questions Norton Blizzard, RN BSN Thoracic Oncology Nurse Navigator at [place] is a nurse navigator that is available to assist you through your cancer journey.  She can answer your questions and/or provide resources regarding your treatment plan, emotional support, or financial concerns.

## 2014-01-01 NOTE — Telephone Encounter (Signed)
pt relative given avs report and made aware central will call w/mri appt to be done @ [facility]. message to MM re 2wk appt and copied to AM as after today I will be out until 01/12/14.

## 2014-01-01 NOTE — Progress Notes (Signed)
[place] [phone]   Fax:([phone] Multidisciplinary thoracic oncology clinic ([place]) CONSULT NOTE  REFERRING PHYSICIAN: Dr. Lance Morin  REASON FOR CONSULTATION:  80 years old with questionable metastatic lung cancer  HPI Karen Adams is a 80 y.o. female with a past medical history significant for atrial fibrillation, COPD, arthritis, macular degeneration, stroke in May of 2014 as well as a remote history of smoking but quit more than 40 years ago. The patient has been doing fine until early January of 2015 when she starts having worsening dyspnea in addition to a cough and chest congestion. She was treated for bronchitis. She presented to the emergency Department at [facility] on 11/19/2013 complaining of worsening dyspnea as well as progressive back pain. Chest x-ray performed on 11/20/2013 showed left suprahilar soft tissue mass versus infiltrate and these findings worrisome for tumor. There was also a small left pleural effusion. This was followed by CT angiogram of the the chest which was negative for pulmonary embolus. It showed Left upper lobe nodule which had measured 2.6 x 1.4 cm on the prior study today measures 4.3 by 2.6 cm. There is new AP window lymphadenopathy with a node measuring 2.5 by 2.3 cm. This lymph node is contiguous with the patient's mass. New infrahilar lymph node measures 1.2 cm. The patient has a new small left pleural effusion. There is a new right upper lobe nodule on image 18 measuring 0.5 cm in diameter. A second new right upper lobe nodule measures 0.5 cm. A new left lower lobe nodule measures 0.8 cm. Multiple additional subcentimeter pulmonary nodules are identified bilaterally. Visualized upper abdomen is unremarkable. Bones demonstrate a destructive lesion in the posterior aspect of the T2 vertebral body which is not seen on the most recent CT. No other or destructive lesion is seen. No lytic lesion is  identified. The patient was seen by [facility] and bronchoscopy revealed mucus plug. A PET scan was performed on 11/28/2013 and it showed hypermetabolic left suprahilar mass most consistent with primary bronchogenic carcinoma. There was bilateral hypermetabolic small pulmonary nodules consistent with pulmonary metastasis. There is hypermetabolic mediastinal nodal metastasis and single metastasis to the body of the pancreas as well as widespread skeletal metastases which are hypermetabolic. New skeletal metastases including lytic lesions in the body of the T2 vertebral body with intense metabolic activity with SUV max of 11.2. There was additional lesion seen in the T5 vertebral body. There are also hypermetabolic lesions within the pelvis and sacrum. The patient was seen by Dr. Pablo Ledger and she received palliative radiotherapy to the T2 lesion. On 12/03/2013, the patient underwent ultrasound guided left thoracentesis. The final cytology showed atypical cells. Dr. Pablo Ledger kindly referred the patient to me today for evaluation and recommendation regarding treatment of her condition. The patient continues to complain of increasing fatigue and weakness as well as shortness of breath at baseline and she is currently on home oxygen. She denied having any significant cough or hemoptysis. She has intermittent substernal chest pain. She recently gained several pounds secondary to fluid retention and swelling of her lower extremities. She denied having any nausea or vomiting. The patient denied having any headache but has blurred vision secondary to macular degeneration. Family history significant for mother and father died from stroke and a sister had lung cancer in her 40s. The patient is married and has 3 children. She was accompanied today by her daughter Joellen Jersey and her grandson Marshell Levan. She used to work as a Charity fundraiser  at [employer]. She has a history of smoking one pack per day for around 20 years but  quit 4 years ago. She has no history of alcohol or drug abuse.  HPI  Past Medical History  Diagnosis Date  . COPD (chronic obstructive pulmonary disease)   . A-fib   . Stroke     memory loss  . Dysrhythmia     A-Fibrillation  . Shortness of breath   . Arthritis   . Nausea   . Cancer     lung,pancreas, bone-new diagnosis  . Macular degeneration     Past Surgical History  Procedure Laterality Date  . Cholecystectomy    . Abdominal hysterectomy    . Eye surgery      bil. cataracts with lens implants  . Thoracentesis  12/03/13    History reviewed. No pertinent family history.  Social History History  Substance Use Topics  . Smoking status: Former Smoker -- 0.50 packs/day for 20 years    Types: Cigarettes    Quit date: 01/18/1979  . Smokeless tobacco: Never Used  . Alcohol Use: No    Allergies  Allergen Reactions  . Kiwi Extract   . Pineapple   . Shellfish Allergy Other (See Comments)    Family is unsure  . Latex Rash    Current Outpatient Prescriptions  Medication Sig Dispense Refill  . acetaminophen (TYLENOL) 325 MG tablet Take 650 mg by mouth every 6 (six) hours as needed for mild pain or headache.      Marland Kitchen atorvastatin (LIPITOR) 10 MG tablet Take 1 tablet (10 mg total) by mouth daily.  30 tablet  0  . dexamethasone (DECADRON) 4 MG tablet       . donepezil (ARICEPT) 10 MG tablet Take 1 tablet (10 mg total) by mouth at bedtime.  30 tablet  3  . folic acid (FOLVITE) 1 MG tablet Take 1 tablet (1 mg total) by mouth daily.  30 tablet  3  . gabapentin (NEURONTIN) 600 MG tablet Take 1 tablet (600 mg total) by mouth at bedtime.  30 tablet  3  . hyaluronate sodium (RADIAPLEXRX) GEL Apply 1 application topically 2 (two) times daily. Apply to affected area after radiation treatments      . LORazepam (ATIVAN) 0.5 MG tablet Take 0.5 mg by mouth every 8 (eight) hours as needed for anxiety or sleep.       . metoprolol tartrate (LOPRESSOR) 25 MG tablet Take 0.5 tablets (12.5 mg  total) by mouth 2 (two) times daily.  60 tablet  3  . NIFEdipine (PROCARDIA-XL/ADALAT CC) 30 MG 24 hr tablet Take 1 tablet (30 mg total) by mouth daily.  30 tablet  3  . oxycodone (OXY-IR) 5 MG capsule Take 10 mg by mouth every 4 (four) hours as needed.      . OxyCODONE (OXYCONTIN) 10 mg T12A 12 hr tablet Take 10 mg by mouth every 12 (twelve) hours.      . pantoprazole (PROTONIX) 40 MG tablet       . prochlorperazine (COMPAZINE) 10 MG tablet Take 1 tablet (10 mg total) by mouth every 6 (six) hours as needed for nausea or vomiting.  30 tablet  0  . sertraline (ZOLOFT) 100 MG tablet Take 1 tablet (100 mg total) by mouth daily.  30 tablet  0  . Tiotropium Bromide Monohydrate (SPIRIVA RESPIMAT) 2.5 MCG/ACT AERS Inhale 2 puffs into the lungs daily.      Marland Kitchen albuterol (PROVENTIL HFA;VENTOLIN HFA) 108 (90  BASE) MCG/ACT inhaler Inhale 2 puffs into the lungs every 6 (six) hours as needed for wheezing or shortness of breath.  1 Inhaler  3  . lidocaine (LIDODERM) 5 % Place 1 patch onto the skin daily as needed (Pain. Applied to back). Remove & Discard patch within 12 hours or as directed by MD      . lisinopril (PRINIVIL,ZESTRIL) 10 MG tablet Take 1 tablet (10 mg total) by mouth daily.  30 tablet  3  . ondansetron (ZOFRAN) 4 MG tablet Take 1 tablet (4 mg total) by mouth every 8 (eight) hours as needed for nausea or vomiting.  30 tablet  0  . traMADol (ULTRAM) 50 MG tablet Take 1 tablet (50 mg total) by mouth every 8 (eight) hours as needed.  30 tablet  0   No current facility-administered medications for this visit.    Review of Systems  Constitutional: positive for anorexia and fatigue Eyes: positive for visual disturbance Ears, nose, mouth, throat, and face: negative Respiratory: positive for cough and dyspnea on exertion Cardiovascular: negative Gastrointestinal: negative Genitourinary:negative Integument/breast: negative Hematologic/lymphatic: negative Musculoskeletal:positive for bone pain and  muscle weakness Neurological: negative Behavioral/Psych: negative Endocrine: negative Allergic/Immunologic: negative  Physical Exam  MLJ:QGBEE, healthy, no distress, cachectic, ill looking and malnourished SKIN: skin color, texture, turgor are normal, no rashes or significant lesions HEAD: Normocephalic, No masses, lesions, tenderness or abnormalities EYES: normal, PERRLA EARS: External ears normal, Canals clear OROPHARYNX:no exudate, no erythema and lips, buccal mucosa, and tongue normal  NECK: supple, no adenopathy, no JVD LYMPH:  no palpable lymphadenopathy, no hepatosplenomegaly BREAST:not examined LUNGS: expiratory wheezes bilaterally, scattered rales bilaterally HEART: regular rate & rhythm, no murmurs and no gallops ABDOMEN:abdomen soft, non-tender, normal bowel sounds and no masses or organomegaly BACK: Back symmetric, no curvature., No CVA tenderness EXTREMITIES:no joint deformities, effusion, or inflammation, no edema, no skin discoloration, no clubbing  NEURO: alert & oriented x 3 with fluent speech, no focal motor/sensory deficits  PERFORMANCE STATUS: ECOG 2  LABORATORY DATA: Lab Results  Component Value Date   WBC 9.4 11/19/2013   HGB 12.5 11/19/2013   HCT 39.3 11/19/2013   MCV 94.7 11/19/2013   PLT 335 11/19/2013      Chemistry      Component Value Date/Time   NA 143 11/19/2013 2315   K 4.7 11/19/2013 2315   CL 103 11/19/2013 2315   CO2 31 11/19/2013 2315   BUN 20 11/19/2013 2315   CREATININE 0.80 11/19/2013 2315      Component Value Date/Time   CALCIUM 9.3 11/19/2013 2315       RADIOGRAPHIC STUDIES: Dg Chest 1 View  12/03/2013   CLINICAL DATA:  Evaluate for pneumothorax post left-sided thoracentesis  EXAM: CHEST - 1 VIEW  COMPARISON:  DG CHEST 2V dated 09/03/2011; US THORACENTESIS ASP PLEURAL SPACE W/IMG GUIDE dated 12/03/2013; CT ANGIO CHEST W/CM &/OR WO/CM dated 11/20/2013; NM PET IMAGE INITIAL (PI) SKULL BASE TO THIGH dated 11/28/2013  FINDINGS: Grossly  unchanged cardiac silhouette and mediastinal contours with atherosclerotic plaque within the thoracic aorta. There is persistent obscuration of the superior aspect of the left hilum secondary to known left suprahilar mass. Interval decrease in residual small left-sided effusion post thoracentesis. No pneumothorax. Unchanged trace right-sided pleural effusion. The lungs remain hyperexpanded with diffuse nodular thickening of the pulmonary interstitium. Scattered bilateral discrete nodules within the bilateral lungs are also unchanged, right greater than left. Unchanged bones. Post cholecystectomy.  IMPRESSION: 1. Interval decrease in residual small left-sided effusion post  thoracentesis. No pneumothorax. 2. Grossly unchanged findings of dominant left suprahilar mass and metastatic disease involving the bilateral lungs as demonstrated on recently performed PET-CT.   Electronically Signed   By: Sandi Mariscal M.D.   On: 12/03/2013 12:06   US Thoracentesis Asp Pleural Space W/img Guide  12/03/2013   CLINICAL DATA:  Left lung mass, left pleural effusion, request for thoracentesis.  EXAM: ULTRASOUND GUIDED diagnostic and therapeutic THORACENTESIS  COMPARISON:  None.  PROCEDURE: An ultrasound guided thoracentesis was thoroughly discussed with the patient and questions answered. The benefits, risks, alternatives and complications were also discussed. The patient understands and wishes to proceed with the procedure. Written consent was obtained.  Ultrasound was performed to localize and mark an adequate pocket of fluid in the left chest. The area was then prepped and draped in the normal sterile fashion. 1% Lidocaine was used for local anesthesia. Under ultrasound guidance a 19 gauge Yueh catheter was introduced. Thoracentesis was performed. The catheter was removed and a dressing applied. Left pleural fluid remains, procedure stopped secondary to patient's request given pain.  Complications:  None.  FINDINGS: A total of  approximately 150 ml of yellow fluid was removed. A fluid sample was sent for laboratory analysis.  IMPRESSION: Successful ultrasound guided left thoracentesis yielding 150 ml of pleural fluid.  Read By:  Tsosie Billing PA-C   Electronically Signed   By: Sandi Mariscal M.D.   On: 12/03/2013 12:28    ASSESSMENT: This is a very pleasant 80 years old white female recently presented with questionable metastatic lung cancer most likely non-small cell carcinoma presented with large left suprahilar mass in addition to mediastinal lymphadenopathy and bilateral pulmonary nodules in addition to the metastatic bones and pancreatic lesion. The patient has remote history of smoking but quit 4 years ago. She status post palliative radiotherapy to the T2 lesion under the care of Dr. Pablo Ledger.   PLAN: I have a lengthy discussion with the patient and her family today about her current disease status and treatment options.  The patient has no tissue diagnosis and I recommended for her to see Dr. Roxan Hockey who was at the multidisciplinary clinic today for evaluation and consideration of repeat bronchoscopy with endobronchial ultrasound for tissue diagnosis. If the final pathology was consistent with non-squamous non-small cell lung cancer, I would consider sending the tissue block for molecular studies for evaluation of EGFR mutation or ALK gene translocation. I also discussed with the patient and her family consolidation of short course of palliative radiotherapy to the left suprahilar mass with the hope to help her breathing until further treatment is given. I spoke to Dr. Pablo Ledger and she will come to her feeding to see if she can give the patient a short course of palliative radiotherapy. I will complete the staging workup by ordering MRI of the brain to rule out brain metastasis. I will see the patient back for followup visit in 2 weeks for reevaluation and more detailed discussion of her treatment options based on the  final staging workup and tissue diagnosis. I gave the patient and her family the time to ask questions and I answered them completely to their satisfactions. The patient was advised to call immediately if she has any concerning symptoms in the interval. The patient was seen during her multidisciplinary thoracic oncology clinic visit today by medical oncology, cardiothoracic surgery, thoracic navigator, social worker as well as a physical therapist. The patient voices understanding of current disease status and treatment options and is in agreement  with the current care plan.  All questions were answered. The patient knows to call the clinic with any problems, questions or concerns. We can certainly see the patient much sooner if necessary.  Thank you so much for allowing me to participate in the care of Karen Adams. I will continue to follow up the patient with you and assist in her care.  I spent 40 minutes counseling the patient face to face. The total time spent in the appointment was 60 minutes.  Disclaimer: This note was dictated with voice recognition software. Similar sounding words can inadvertently be transcribed and may not be corrected upon review.   Curt Bears 01/01/2014, 4:57 PM

## 2014-01-01 NOTE — Progress Notes (Signed)
[facility] Clinical Social Work  Clinical Social Work met with patient/family and Futures trader at [facility] appointment to offer support and assess for psychosocial needs.  Medical oncologist reviewed patient's diagnosis and discussed need for more patient to recommend treatment plan.  MD asked questions regarding patient's quality of life and functional status prior to diagnosis.  The patient was very active as of January of this year.  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  The patient lives with her daughter, grandson, and spouse (who has alzheimer's).  Clinical Social Work briefly discussed Clinical Social Work role and [employer] support programs/services.  Patient's daughter asked questions regarding home care services.  Patient's family currently feeling overwhelmed and "burned out" due to caretaking responsibilities and work.  Clinical Social Work encouraged patient to call with any additional questions or concerns.   Polo Riley, MSW, LCSW, OSW-C Clinical Social Worker [facility] [phone]

## 2014-01-02 ENCOUNTER — Ambulatory Visit: Payer: Medicare Other

## 2014-01-02 NOTE — Progress Notes (Signed)
  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   She had a decrease in her pain but did have to increase her pain medications.  Plan: The patient has completed radiation treatment. The patient will return to radiation oncology clinic for routine followup in one month. At her family's request, she has been sent to medical oncology for consideration of chemotherapy. She is also followed at home by a palliative care nurse.  I advised them to call or return sooner if they have any questions or concerns related to their recovery or treatment.  ------------------------------------------------  Thea Silversmith, MD

## 2014-01-05 ENCOUNTER — Telehealth: Payer: Self-pay | Admitting: Internal Medicine

## 2014-01-05 ENCOUNTER — Telehealth: Payer: Self-pay | Admitting: *Deleted

## 2014-01-05 NOTE — Telephone Encounter (Signed)
Called pt daughter to check up from [place] last week.  Daughter stated the family would like to move forward to get tissue diagnosis.  I will call Ebony Hail at [facility] to help set up.

## 2014-01-06 ENCOUNTER — Ambulatory Visit (HOSPITAL_COMMUNITY)
Admission: RE | Admit: 2014-01-06 | Discharge: 2014-01-06 | Disposition: A | Discharge status: Home / Self Care | Payer: Medicare Other | Source: Ambulatory Visit | Attending: Internal Medicine | Admitting: Internal Medicine

## 2014-01-06 ENCOUNTER — Other Ambulatory Visit: Payer: Self-pay | Admitting: *Deleted

## 2014-01-06 ENCOUNTER — Encounter (HOSPITAL_COMMUNITY): Payer: Self-pay | Admitting: *Deleted

## 2014-01-06 DIAGNOSIS — C3490 Malignant neoplasm of unspecified part of unspecified bronchus or lung: Secondary | ICD-10-CM

## 2014-01-06 LAB — POCT I-STAT CREATININE: Creatinine, Ser: 0.7 mg/dL (ref 0.50–1.10)

## 2014-01-06 MED ORDER — GADOBENATE DIMEGLUMINE 529 MG/ML IV SOLN
10.0000 mL | Freq: Once | INTRAVENOUS | Status: AC | PRN
  Administered 2014-01-06: 10 mL via INTRAVENOUS

## 2014-01-06 NOTE — Progress Notes (Signed)
Pt on home O2 at 2 L all the time. Spoke with pt's daughter, Albertha Ghee for pre-op call.

## 2014-01-07 ENCOUNTER — Ambulatory Visit (HOSPITAL_COMMUNITY): Payer: Medicare Other

## 2014-01-07 ENCOUNTER — Encounter (HOSPITAL_COMMUNITY)
Admission: RE | Disposition: A | Discharge status: Home / Self Care | Payer: Self-pay | Source: Ambulatory Visit | Attending: Thoracic Surgery (Cardiothoracic Vascular Surgery)

## 2014-01-07 ENCOUNTER — Encounter (HOSPITAL_COMMUNITY): Payer: Medicare Other | Admitting: Certified Registered"

## 2014-01-07 ENCOUNTER — Ambulatory Visit (HOSPITAL_COMMUNITY)
Admission: RE | Admit: 2014-01-07 | Discharge: 2014-01-07 | Disposition: A | Discharge status: Home / Self Care | Payer: Medicare Other | Source: Ambulatory Visit | Attending: Thoracic Surgery (Cardiothoracic Vascular Surgery) | Admitting: Thoracic Surgery (Cardiothoracic Vascular Surgery)

## 2014-01-07 ENCOUNTER — Encounter (HOSPITAL_COMMUNITY): Payer: Self-pay | Admitting: *Deleted

## 2014-01-07 ENCOUNTER — Ambulatory Visit (HOSPITAL_COMMUNITY): Payer: Medicare Other | Admitting: Certified Registered"

## 2014-01-07 DIAGNOSIS — R413 Other amnesia: Secondary | ICD-10-CM | POA: Insufficient documentation

## 2014-01-07 DIAGNOSIS — J4489 Other specified chronic obstructive pulmonary disease: Secondary | ICD-10-CM | POA: Insufficient documentation

## 2014-01-07 DIAGNOSIS — Z923 Personal history of irradiation: Secondary | ICD-10-CM | POA: Insufficient documentation

## 2014-01-07 DIAGNOSIS — C3490 Malignant neoplasm of unspecified part of unspecified bronchus or lung: Secondary | ICD-10-CM

## 2014-01-07 DIAGNOSIS — C7952 Secondary malignant neoplasm of bone marrow: Secondary | ICD-10-CM

## 2014-01-07 DIAGNOSIS — Z87891 Personal history of nicotine dependence: Secondary | ICD-10-CM | POA: Insufficient documentation

## 2014-01-07 DIAGNOSIS — Z91018 Allergy to other foods: Secondary | ICD-10-CM | POA: Insufficient documentation

## 2014-01-07 DIAGNOSIS — Z91040 Latex allergy status: Secondary | ICD-10-CM | POA: Insufficient documentation

## 2014-01-07 DIAGNOSIS — R6889 Other general symptoms and signs: Secondary | ICD-10-CM | POA: Insufficient documentation

## 2014-01-07 DIAGNOSIS — I10 Essential (primary) hypertension: Secondary | ICD-10-CM | POA: Insufficient documentation

## 2014-01-07 DIAGNOSIS — Z91013 Allergy to seafood: Secondary | ICD-10-CM | POA: Insufficient documentation

## 2014-01-07 DIAGNOSIS — C7951 Secondary malignant neoplasm of bone: Secondary | ICD-10-CM | POA: Insufficient documentation

## 2014-01-07 DIAGNOSIS — Z79899 Other long term (current) drug therapy: Secondary | ICD-10-CM | POA: Insufficient documentation

## 2014-01-07 DIAGNOSIS — I739 Peripheral vascular disease, unspecified: Secondary | ICD-10-CM | POA: Insufficient documentation

## 2014-01-07 DIAGNOSIS — C801 Malignant (primary) neoplasm, unspecified: Secondary | ICD-10-CM | POA: Insufficient documentation

## 2014-01-07 DIAGNOSIS — J90 Pleural effusion, not elsewhere classified: Secondary | ICD-10-CM | POA: Insufficient documentation

## 2014-01-07 DIAGNOSIS — R918 Other nonspecific abnormal finding of lung field: Secondary | ICD-10-CM | POA: Insufficient documentation

## 2014-01-07 DIAGNOSIS — J449 Chronic obstructive pulmonary disease, unspecified: Secondary | ICD-10-CM | POA: Insufficient documentation

## 2014-01-07 DIAGNOSIS — I69919 Unspecified symptoms and signs involving cognitive functions following unspecified cerebrovascular disease: Secondary | ICD-10-CM | POA: Insufficient documentation

## 2014-01-07 DIAGNOSIS — C7889 Secondary malignant neoplasm of other digestive organs: Secondary | ICD-10-CM | POA: Insufficient documentation

## 2014-01-07 DIAGNOSIS — R599 Enlarged lymph nodes, unspecified: Secondary | ICD-10-CM | POA: Insufficient documentation

## 2014-01-07 DIAGNOSIS — R222 Localized swelling, mass and lump, trunk: Secondary | ICD-10-CM | POA: Insufficient documentation

## 2014-01-07 DIAGNOSIS — R9389 Abnormal findings on diagnostic imaging of other specified body structures: Secondary | ICD-10-CM | POA: Insufficient documentation

## 2014-01-07 DIAGNOSIS — I4891 Unspecified atrial fibrillation: Secondary | ICD-10-CM | POA: Insufficient documentation

## 2014-01-07 HISTORY — DX: Dependence on supplemental oxygen: Z99.81

## 2014-01-07 HISTORY — DX: Depression, unspecified: F32.A

## 2014-01-07 HISTORY — PX: THORACENTESIS: SHX235

## 2014-01-07 HISTORY — DX: Aortic aneurysm of unspecified site, without rupture: I71.9

## 2014-01-07 HISTORY — DX: Major depressive disorder, single episode, unspecified: F32.9

## 2014-01-07 HISTORY — DX: Nausea with vomiting, unspecified: R11.2

## 2014-01-07 HISTORY — DX: Other specified postprocedural states: Z98.890

## 2014-01-07 HISTORY — DX: Personal history of other infectious and parasitic diseases: Z86.19

## 2014-01-07 HISTORY — PX: VIDEO BRONCHOSCOPY WITH ENDOBRONCHIAL ULTRASOUND: SHX6177

## 2014-01-07 HISTORY — DX: Unspecified asthma, uncomplicated: J45.909

## 2014-01-07 HISTORY — DX: Adverse effect of unspecified anesthetic, initial encounter: T41.45XA

## 2014-01-07 HISTORY — DX: Essential (primary) hypertension: I10

## 2014-01-07 HISTORY — DX: Other complications of anesthesia, initial encounter: T88.59XA

## 2014-01-07 LAB — CBC
HCT: 42.5 % (ref 36.0–46.0)
Hemoglobin: 12.7 g/dL (ref 12.0–15.0)
MCH: 28.2 pg (ref 26.0–34.0)
MCHC: 29.9 g/dL — ABNORMAL LOW (ref 30.0–36.0)
MCV: 94.4 fL (ref 78.0–100.0)
Platelets: 260 10*3/uL (ref 150–400)
RBC: 4.5 MIL/uL (ref 3.87–5.11)
RDW: 15.4 % (ref 11.5–15.5)
WBC: 12.5 10*3/uL — AB (ref 4.0–10.5)

## 2014-01-07 LAB — COMPREHENSIVE METABOLIC PANEL
ALK PHOS: 173 U/L — AB (ref 39–117)
ALT: 27 U/L (ref 0–35)
AST: 21 U/L (ref 0–37)
Albumin: 2.6 g/dL — ABNORMAL LOW (ref 3.5–5.2)
BUN: 21 mg/dL (ref 6–23)
CO2: 30 meq/L (ref 19–32)
CREATININE: 0.51 mg/dL (ref 0.50–1.10)
Calcium: 9.5 mg/dL (ref 8.4–10.5)
Chloride: 97 mEq/L (ref 96–112)
GFR calc Af Amer: >90 mL/min (ref >90)
GFR, EST NON AFRICAN AMERICAN: 87 mL/min — AB (ref >90)
Glucose, Bld: 106 mg/dL — ABNORMAL HIGH (ref 70–99)
Potassium: 4.3 mEq/L (ref 3.7–5.3)
SODIUM: 140 meq/L (ref 137–147)
Total Bilirubin: 0.3 mg/dL (ref 0.3–1.2)
Total Protein: 6.6 g/dL (ref 6.0–8.3)

## 2014-01-07 LAB — PROTIME-INR
INR: 0.98 (ref 0.00–1.49)
Prothrombin Time: 12.8 seconds (ref 11.6–15.2)

## 2014-01-07 LAB — APTT: aPTT: 24 seconds (ref 24–37)

## 2014-01-07 SURGERY — BRONCHOSCOPY, WITH EBUS
Anesthesia: General | Site: Chest

## 2014-01-07 MED ORDER — PROPOFOL 10 MG/ML IV BOLUS
INTRAVENOUS | Status: AC
  Filled 2014-01-07: qty 20

## 2014-01-07 MED ORDER — NEOSTIGMINE METHYLSULFATE 10 MG/10ML IV SOLN
INTRAVENOUS | Status: AC
  Filled 2014-01-07: qty 1

## 2014-01-07 MED ORDER — FENTANYL CITRATE 0.05 MG/ML IJ SOLN
INTRAMUSCULAR | Status: AC
  Filled 2014-01-07: qty 5

## 2014-01-07 MED ORDER — GLYCOPYRROLATE 0.2 MG/ML IJ SOLN
INTRAMUSCULAR | Status: DC | PRN
  Administered 2014-01-07: 0.2 mg via INTRAVENOUS

## 2014-01-07 MED ORDER — LACTATED RINGERS IV SOLN
INTRAVENOUS | Status: DC
  Administered 2014-01-07: 11:00:00 via INTRAVENOUS

## 2014-01-07 MED ORDER — ALBUTEROL SULFATE (2.5 MG/3ML) 0.083% IN NEBU
2.5000 mg | INHALATION_SOLUTION | Freq: Once | RESPIRATORY_TRACT | Status: AC
  Administered 2014-01-07: 2.5 mg via RESPIRATORY_TRACT

## 2014-01-07 MED ORDER — NEOSTIGMINE METHYLSULFATE 10 MG/10ML IV SOLN
INTRAVENOUS | Status: DC | PRN
  Administered 2014-01-07: 2 mg via INTRAVENOUS

## 2014-01-07 MED ORDER — LIDOCAINE HCL (CARDIAC) 20 MG/ML IV SOLN
INTRAVENOUS | Status: DC | PRN
  Administered 2014-01-07: 100 mg via INTRAVENOUS

## 2014-01-07 MED ORDER — PROPOFOL 10 MG/ML IV BOLUS
INTRAVENOUS | Status: DC | PRN
  Administered 2014-01-07: 110 mg via INTRAVENOUS

## 2014-01-07 MED ORDER — PHENYLEPHRINE HCL 10 MG/ML IJ SOLN
10.0000 mg | INTRAVENOUS | Status: DC | PRN
  Administered 2014-01-07: 20 ug/min via INTRAVENOUS

## 2014-01-07 MED ORDER — 0.9 % SODIUM CHLORIDE (POUR BTL) OPTIME
TOPICAL | Status: DC | PRN
  Administered 2014-01-07: 1000 mL

## 2014-01-07 MED ORDER — EPINEPHRINE HCL 1 MG/ML IJ SOLN
INTRAMUSCULAR | Status: DC | PRN
  Administered 2014-01-07: 1 mg

## 2014-01-07 MED ORDER — FENTANYL CITRATE 0.05 MG/ML IJ SOLN
INTRAMUSCULAR | Status: DC | PRN
  Administered 2014-01-07: 25 ug via INTRAVENOUS

## 2014-01-07 MED ORDER — LIDOCAINE HCL (CARDIAC) 20 MG/ML IV SOLN
INTRAVENOUS | Status: AC
  Filled 2014-01-07: qty 5

## 2014-01-07 MED ORDER — GLYCOPYRROLATE 0.2 MG/ML IJ SOLN
INTRAMUSCULAR | Status: AC
  Filled 2014-01-07: qty 1

## 2014-01-07 MED ORDER — ALBUTEROL SULFATE (2.5 MG/3ML) 0.083% IN NEBU
INHALATION_SOLUTION | RESPIRATORY_TRACT | Status: AC
  Administered 2014-01-07: 2.5 mg via RESPIRATORY_TRACT
  Filled 2014-01-07: qty 3

## 2014-01-07 MED ORDER — ONDANSETRON HCL 4 MG/2ML IJ SOLN
4.0000 mg | Freq: Once | INTRAMUSCULAR | Status: DC | PRN
Start: 2014-01-07 — End: 2014-01-07

## 2014-01-07 MED ORDER — ONDANSETRON HCL 4 MG/2ML IJ SOLN
INTRAMUSCULAR | Status: DC | PRN
  Administered 2014-01-07: 4 mg via INTRAVENOUS

## 2014-01-07 MED ORDER — ROCURONIUM BROMIDE 100 MG/10ML IV SOLN
INTRAVENOUS | Status: DC | PRN
  Administered 2014-01-07: 25 mg via INTRAVENOUS

## 2014-01-07 MED ORDER — ARTIFICIAL TEARS OP OINT
TOPICAL_OINTMENT | OPHTHALMIC | Status: AC
  Filled 2014-01-07: qty 3.5

## 2014-01-07 MED ORDER — HYDROMORPHONE HCL PF 1 MG/ML IJ SOLN: 0.2500 mg | INTRAMUSCULAR | Status: DC | PRN

## 2014-01-07 SURGICAL SUPPLY — 35 items
BLADE 10 SAFETY STRL DISP (BLADE) IMPLANT
BNDG COHESIVE 3X5 WHT NS (GAUZE/BANDAGES/DRESSINGS) ×4 IMPLANT
BRUSH CYTOL CELLEBRITY 1.5X140 (MISCELLANEOUS) ×4 IMPLANT
CANISTER SUCTION 2500CC (MISCELLANEOUS) ×4 IMPLANT
CONT SPEC 4OZ CLIKSEAL STRL BL (MISCELLANEOUS) ×12 IMPLANT
COTTONBALL LRG STERILE PKG (GAUZE/BANDAGES/DRESSINGS) IMPLANT
COVER TABLE BACK 60X90 (DRAPES) ×4 IMPLANT
ELECT REM PT RETURN 9FT ADLT (ELECTROSURGICAL) ×4
ELECTRODE REM PT RTRN 9FT ADLT (ELECTROSURGICAL) ×2 IMPLANT
FILTER STRAW FLUID ASPIR (MISCELLANEOUS) IMPLANT
FORCEPS BIOP RJ4 1.8 (CUTTING FORCEPS) IMPLANT
GAUZE SPONGE 4X4 16PLY XRAY LF (GAUZE/BANDAGES/DRESSINGS) ×4 IMPLANT
GLOVE SURG SIGNA 7.5 PF LTX (GLOVE) IMPLANT
GOWN STRL REUS W/ TWL XL LVL3 (GOWN DISPOSABLE) IMPLANT
GOWN STRL REUS W/TWL XL LVL3 (GOWN DISPOSABLE)
KIT ROOM TURNOVER OR (KITS) ×4 IMPLANT
MARKER SKIN DUAL TIP RULER LAB (MISCELLANEOUS) ×4 IMPLANT
NEEDLE 22X1 1/2 (OR ONLY) (NEEDLE) IMPLANT
NEEDLE BIOPSY TRANSBRONCH 21G (NEEDLE) IMPLANT
NEEDLE BLUNT 18X1 FOR OR ONLY (NEEDLE) IMPLANT
NEEDLE SYS SONOTIP II EBUSTBNA (NEEDLE) ×4 IMPLANT
NS IRRIG 1000ML POUR BTL (IV SOLUTION) ×4 IMPLANT
OIL SILICONE PENTAX (PARTS (SERVICE/REPAIRS)) ×4 IMPLANT
PACK SURGICAL SETUP 50X90 (CUSTOM PROCEDURE TRAY) ×4 IMPLANT
PAD ARMBOARD 7.5X6 YLW CONV (MISCELLANEOUS) ×8 IMPLANT
SPONGE GAUZE 4X4 12PLY (GAUZE/BANDAGES/DRESSINGS) ×4 IMPLANT
SYR 20CC LL (SYRINGE) ×4 IMPLANT
SYR 20ML ECCENTRIC (SYRINGE) ×4 IMPLANT
SYR 5ML LL (SYRINGE) ×4 IMPLANT
SYR 5ML LUER SLIP (SYRINGE) ×4 IMPLANT
SYR CONTROL 10ML LL (SYRINGE) IMPLANT
TOWEL OR 17X24 6PK STRL BLUE (TOWEL DISPOSABLE) ×4 IMPLANT
TRAP SPECIMEN MUCOUS 40CC (MISCELLANEOUS) ×4 IMPLANT
TUBE CONNECTING 12'X1/4 (SUCTIONS) ×1
TUBE CONNECTING 12X1/4 (SUCTIONS) ×3 IMPLANT

## 2014-01-07 NOTE — Transfer of Care (Signed)
Immediate Anesthesia Transfer of Care Note  Patient: Karen Adams  Procedure(s) Performed: Procedure(s): VIDEO BRONCHOSCOPY WITH ENDOBRONCHIAL ULTRASOUND (N/A) THORACENTESIS (Left)  Patient Location: PACU  Anesthesia Type:General  Level of Consciousness: sedated  Airway & Oxygen Therapy: Patient Spontanous Breathing and Patient connected to nasal cannula oxygen  Post-op Assessment: Report given to PACU RN and Post -op Vital signs reviewed and stable  Post vital signs: Reviewed and stable  Complications: No apparent anesthesia complications

## 2014-01-07 NOTE — Anesthesia Procedure Notes (Signed)
Procedure Name: Intubation Date/Time: 01/07/2014 4:12 PM Performed by: Trixie Deis A Pre-anesthesia Checklist: Patient identified, Timeout performed, Emergency Drugs available, Suction available and Patient being monitored Patient Re-evaluated:Patient Re-evaluated prior to inductionOxygen Delivery Method: Circle system utilized Preoxygenation: Pre-oxygenation with 100% oxygen Intubation Type: IV induction Ventilation: Mask ventilation without difficulty Laryngoscope Size: Mac and 3 Grade View: Grade I Tube type: Oral Tube size: 8.5 mm Number of attempts: 1 Airway Equipment and Method: Stylet and LTA kit utilized Placement Confirmation: ETT inserted through vocal cords under direct vision,  breath sounds checked- equal and bilateral and positive ETCO2 Secured at: 21 cm Tube secured with: Tape Dental Injury: Teeth and Oropharynx as per pre-operative assessment

## 2014-01-07 NOTE — Interval H&P Note (Signed)
History and Physical Interval Note:  Preop CXR this AM shows a large left effusion She has markedly diminished BS on the left I discussed adding a left thoracentesis to the procedure with the patient and her daughter They understand the indications, risks, benefits and alternatives. They agree to addition of left thoracentesis  01/07/2014 4:02 PM  Karen Adams  has presented today for surgery, with the diagnosis of LUNG MASS/CANCER  The various methods of treatment have been discussed with the patient and family. After consideration of risks, benefits and other options for treatment, the patient has consented to  Procedure(s): [place] (N/A) as a surgical intervention .  The patient's history has been reviewed, patient examined, no change in status, stable for surgery.  I have reviewed the patient's chart and labs.  Questions were answered to the patient's satisfaction.     Melrose Nakayama

## 2014-01-07 NOTE — Brief Op Note (Signed)
01/07/2014  5:45 PM  PATIENT:  Lavonna Monarch  80 y.o. female  PRE-OPERATIVE DIAGNOSIS:  LUNG MASS/CANCER/ LEFT PLEURAL EFFUSION  POST-OPERATIVE DIAGNOSIS:  LUNG MASS/CANCER/ LEFT PLEURAL EFFUSION  PROCEDURE:  Procedure(s): VIDEO BRONCHOSCOPY WITH ENDOBRONCHIAL ULTRASOUND (N/A) THORACENTESIS (Left)  SURGEON:  Surgeon(s) and Role:    * Melrose Nakayama, MD - Primary  PHYSICIAN ASSISTANT:   ASSISTANTS: none   ANESTHESIA:   general  EBL:  Total I/O In: 800 [I.V.:800] Out: -   BLOOD ADMINISTERED:none  DRAINS: none   LOCAL MEDICATIONS USED:  NONE  SPECIMEN:  Source of Specimen:  lymph node aspirates, brushings, pleural fluid  DISPOSITION OF SPECIMEN:  PATHOLOGY  COUNTS:  NO N/A  PLAN OF CARE: Discharge to home after PACU  PATIENT DISPOSITION:  PACU - hemodynamically stable.   Delay start of Pharmacological VTE agent (>24hrs) due to surgical blood loss or risk of bleeding: not applicable

## 2014-01-07 NOTE — Discharge Instructions (Addendum)
Do not drive or engage in heavy physical activity for 24 hours  You may cough up small amounts of blood(a teaspoon or less) over the next few days  Call [phone] if you develop chest pain, shortness of breath or a temperature > 101  My office will contact you with follow up instructions

## 2014-01-07 NOTE — Anesthesia Postprocedure Evaluation (Signed)
Anesthesia Post Note  Patient: Karen Adams  Procedure(s) Performed: Procedure(s) (LRB): VIDEO BRONCHOSCOPY WITH ENDOBRONCHIAL ULTRASOUND (N/A) THORACENTESIS (Left)  Anesthesia type: General  Patient location: PACU  Post pain: Pain level controlled and Adequate analgesia  Post assessment: Post-op Vital signs reviewed, Patient's Cardiovascular Status Stable, Respiratory Function Stable, Patent Airway and Pain level controlled  Last Vitals:  Filed Vitals:   01/07/14 1815  BP: 144/54  Pulse: 115  Temp:   Resp: 20    Post vital signs: Reviewed and stable  Level of consciousness: awake, alert  and oriented  Complications: No apparent anesthesia complications

## 2014-01-07 NOTE — Progress Notes (Signed)
Name: [name]   MRN: [id number]  Date:  12/15/13   DOB: 1928-12-11  Status:outpatient    DIAGNOSIS: Stage IV lung cancer  CONSENT VERIFIED: yes   SET UP: Patient is setup supine   IMMOBILIZATION:  The following immobilization was used: moldable custom mask  NARRATIVE:  Pt Karen Adams was brought to the [facility].  Identity was confirmed.  All relevant records and images related to the planned course of therapy were reviewed.  Then, the patient was positioned in a stable reproducible clinical set-up for radiation therapy.  CT images were obtained.  An isocenter was placed. Skin markings were placed.  The CT images were loaded into the planning software where the target and avoidance structures were contoured.  The radiation prescription was entered and confirmed. The patient was discharged in stable condition and tolerated simulation well.    TREATMENT PLANNING NOTE:  Treatment planning then occurred. I have requested : MLC's, isodose plan, basic dose calculation  I have requested 3 dimensional simulation with DVH of cord, lung and GTV  A total of 2 complex treatment devices were formed including MLCs used to protect critical normal structures and the mask

## 2014-01-07 NOTE — Anesthesia Preprocedure Evaluation (Addendum)
Anesthesia Evaluation  Patient identified by MRN, date of birth, ID band Patient awake    Reviewed: Allergy & Precautions, H&P , NPO status , Patient's Chart, lab work & pertinent test results  History of Anesthesia Complications (+) PONV  Airway       Dental   Pulmonary asthma , COPDformer smoker,          Cardiovascular hypertension, + Peripheral Vascular Disease + dysrhythmias Atrial Fibrillation     Neuro/Psych CVA    GI/Hepatic   Endo/Other    Renal/GU      Musculoskeletal   Abdominal   Peds  Hematology   Anesthesia Other Findings Lung CA  Reproductive/Obstetrics                          Anesthesia Physical Anesthesia Plan  ASA: III  Anesthesia Plan: General   Post-op Pain Management:    Induction: Intravenous  Airway Management Planned: Oral ETT  Additional Equipment:   Intra-op Plan:   Post-operative Plan: Extubation in OR  Informed Consent: I have reviewed the patients History and Physical, chart, labs and discussed the procedure including the risks, benefits and alternatives for the proposed anesthesia with the patient or authorized representative who has indicated his/her understanding and acceptance.     Plan Discussed with:   Anesthesia Plan Comments:         Anesthesia Quick Evaluation

## 2014-01-07 NOTE — H&P (View-Only) (Signed)
PCP is Truitt Merle, NP Referring Provider is Burtis Junes, NP  No chief complaint on file.   HPI: 80 yo woman with a cc/o shortness of breath.  80 yo woman with multiple medical problems who recently was found to have a lung mass. She was admitted to [facility] in January with respiratory distress. A CT showed a lung mass, but bronchoscopy was nondiagnostic. She then developed back pain. W/u including a CT angio and PET showed stage IV lung cancer. She had a thoracentesis that showed atypical cells but no definitive cancer. Palliative radation was started and has helped her pain. She had been scheduled for a bronch but it was cancelled by the family because she was not doing well. She has improved and is now eating. She is mostly confined to a chair but can walk around her house some. She has gained about 25 pounds over the past 8 weeks.   She is now referred to Dr. Julien Nordmann and is interested in chemo but needs a tissue diagnosis in order to give it.  Past Medical History  Diagnosis Date  . COPD (chronic obstructive pulmonary disease)   . A-fib   . Stroke     memory loss  . Dysrhythmia     A-Fibrillation  . Shortness of breath   . Arthritis   . Nausea   . Cancer     lung,pancreas, bone-new diagnosis  . Macular degeneration     Past Surgical History  Procedure Laterality Date  . Cholecystectomy    . Abdominal hysterectomy    . Eye surgery      bil. cataracts with lens implants  . Thoracentesis  12/03/13    No family history on file.  Social History History  Substance Use Topics  . Smoking status: Former Smoker -- 0.50 packs/day for 20 years    Types: Cigarettes    Quit date: 01/18/1979  . Smokeless tobacco: Never Used  . Alcohol Use: No    Current Outpatient Prescriptions  Medication Sig Dispense Refill  . acetaminophen (TYLENOL) 325 MG tablet Take 650 mg by mouth every 6 (six) hours as needed for mild pain or headache.      . albuterol (PROVENTIL HFA;VENTOLIN HFA)  108 (90 BASE) MCG/ACT inhaler Inhale 2 puffs into the lungs every 6 (six) hours as needed for wheezing or shortness of breath.  1 Inhaler  3  . atorvastatin (LIPITOR) 10 MG tablet Take 1 tablet (10 mg total) by mouth daily.  30 tablet  0  . dexamethasone (DECADRON) 4 MG tablet       . donepezil (ARICEPT) 10 MG tablet Take 1 tablet (10 mg total) by mouth at bedtime.  30 tablet  3  . folic acid (FOLVITE) 1 MG tablet Take 1 tablet (1 mg total) by mouth daily.  30 tablet  3  . gabapentin (NEURONTIN) 600 MG tablet Take 1 tablet (600 mg total) by mouth at bedtime.  30 tablet  3  . hyaluronate sodium (RADIAPLEXRX) GEL Apply 1 application topically 2 (two) times daily. Apply to affected area after radiation treatments      . lidocaine (LIDODERM) 5 % Place 1 patch onto the skin daily as needed (Pain. Applied to back). Remove & Discard patch within 12 hours or as directed by MD      . lisinopril (PRINIVIL,ZESTRIL) 10 MG tablet Take 1 tablet (10 mg total) by mouth daily.  30 tablet  3  . LORazepam (ATIVAN) 0.5 MG tablet Take 0.5 mg by  mouth every 8 (eight) hours as needed for anxiety or sleep.       . metoprolol tartrate (LOPRESSOR) 25 MG tablet Take 0.5 tablets (12.5 mg total) by mouth 2 (two) times daily.  60 tablet  3  . NIFEdipine (PROCARDIA-XL/ADALAT CC) 30 MG 24 hr tablet Take 1 tablet (30 mg total) by mouth daily.  30 tablet  3  . ondansetron (ZOFRAN) 4 MG tablet Take 1 tablet (4 mg total) by mouth every 8 (eight) hours as needed for nausea or vomiting.  30 tablet  0  . oxycodone (OXY-IR) 5 MG capsule Take 10 mg by mouth every 4 (four) hours as needed.      . OxyCODONE (OXYCONTIN) 10 mg T12A 12 hr tablet Take 10 mg by mouth every 12 (twelve) hours.      . pantoprazole (PROTONIX) 40 MG tablet       . prochlorperazine (COMPAZINE) 10 MG tablet Take 1 tablet (10 mg total) by mouth every 6 (six) hours as needed for nausea or vomiting.  30 tablet  0  . sertraline (ZOLOFT) 100 MG tablet Take 1 tablet (100 mg  total) by mouth daily.  30 tablet  0  . Tiotropium Bromide Monohydrate (SPIRIVA RESPIMAT) 2.5 MCG/ACT AERS Inhale 2 puffs into the lungs daily.      . traMADol (ULTRAM) 50 MG tablet Take 1 tablet (50 mg total) by mouth every 8 (eight) hours as needed.  30 tablet  0   No current facility-administered medications for this visit.    Allergies  Allergen Reactions  . Kiwi Extract   . Pineapple   . Shellfish Allergy Other (See Comments)    Family is unsure  . Latex Rash    Review of Systems  Constitutional: Positive for activity change and unexpected weight change (has gained 25 pounds in last 2 months).  Respiratory: Positive for cough and shortness of breath.        Home O2  Musculoskeletal: Positive for back pain.  Neurological:       Memory problems  Hematological: Bruises/bleeds easily.    BP 125/57  Pulse 76  Temp(Src) 98 F (36.7 C)  Resp 18  Wt 121 lb 4.8 oz (55.021 kg)  SpO2 98% Physical Exam  Vitals reviewed. Constitutional: No distress.  Elderly, frail  HENT:  Head: Normocephalic and atraumatic.  Wearing O2  Eyes: EOM are normal. Pupils are equal, round, and reactive to light.  Neck: No tracheal deviation present.  Cardiovascular:  Irregularly irregular  Pulmonary/Chest: She has no wheezes.  Diminished BS bilaterally  Abdominal: Soft.  Musculoskeletal: She exhibits edema (2+).  Lymphadenopathy:    She has no cervical adenopathy.  Neurological: She is alert.  Very little speech  Skin: Skin is warm and dry.     Diagnostic Tests: NUCLEAR MEDICINE PET SKULL BASE TO THIGH  TECHNIQUE:  6.5 mCi F-18 FDG was injected intravenously. Full-ring PET imaging  was performed from the skull base to thigh after the radiotracer. CT  data was obtained and used for attenuation correction and anatomic  localization.  FASTING BLOOD GLUCOSE: Value: 118 mg/dl  COMPARISON: CT ANGIO CHEST W/CM &/OR WO/CM dated 11/20/2013; CT  ANGIO CHEST-ABD-PELV dated 09/15/2013  FINDINGS:   NECK  No hypermetabolic lymph nodes in the neck.  CHEST  Enlarging left upper lobe suprahilar mass is intensely  hypermetabolic with SUV max equal 9.9. The mass measures  approximately 4.8 by 3.3 cm. There are bilateral small 1 cm  pulmonary nodules which are hypermetabolic  consistent with bilateral  pulmonary metastasis. There are prevascular and paratracheal lymph  nodes which are hypermetabolic consistent with mediastinal nodal  metastasis.  There is a small left effusion which is increased compared to prior.  ABDOMEN/PELVIS  There is a focus of discrete hypermetabolic activity within the body  of the pancreas with SUV max = 9.4. Lesion is difficult to define on  the non contrast CT but is present on image 114. No liver metastasis  evident. No adrenal metastasis.  SKELETON  There is widespread skeletal metastasis. There is a lytic lesion in  the body of the T2 vertebral body with intense metabolic activity  (SUV max 11.2). Additional lesion seen in the T5 vertebral body.  There are hypermetabolic lesions within the the pelvis and sacrum.  For example lesion in the posterior right iliac bone measuring 2.2  cm with SUV max 15.5. Additional smaller metastatic lesions  scattered throughout the spine and sternum.  IMPRESSION:  1. Hypermetabolic left suprahilar mass most consistent with primary  bronchogenic carcinoma.  2. Bilateral hypermetabolic small pulmonary nodules consistent with  pulmonary metastasis.  3. Hypermetabolic mediastinal nodal metastasis.  4. Single metastasis to the body of the pancreas.  5. Widespread skeletal metastasis which are hypermetabolic.  Electronically Signed  By: Suzy Bouchard M.D.  On: 11/28/2013 09:04   Impression: 80 yo woman with stage IV cancer who needs a tissue diagnosis and tissue for molecular testing to be treated with palliative chemo.   I offered to do bronchoscopy and EBUS to try to provide adequate diagnostic material. I described  the procedure to the patient who I don't think understands, as well as her daughter. I discussed the need for general anesthesia, the general nature of the procedure and the outpatient nature of the procedure. I do think she is high risk for any type of procedure due to her frailty and poor functional status.   We discussed the indications, risks, benefits, and alternatives. They understand this is diagnostic only. They understand ~ 90% chance of successful diagnosis. They understand risks include but are not limited to death, MI, Stroke, bleeding, pneumothorax as well as the possibility of unforeseeable complications.  Her daughter needs to discuss with her siblings and will call if they want to proceed

## 2014-01-08 NOTE — Op Note (Signed)
NAMEBRIYA, LOOKABAUGH                ACCOUNT NO.:  [id number]  MEDICAL RECORD NO.:  [id number]  LOCATION:  [facility]                         FACILITY:  [facility]  PHYSICIAN:  Revonda Standard. Roxan Hockey, M.D.DATE OF BIRTH:  10/08/1928  DATE OF PROCEDURE:  01/07/2014 DATE OF DISCHARGE:  01/07/2014                              OPERATIVE REPORT   PREOPERATIVE DIAGNOSES:  Left lung mass, likely lung cancer, left pleural effusion.  POSTOPERATIVE DIAGNOSIS:  Left lung mass, likely lung cancer, left pleural effusion.  PROCEDURE:  Video bronchoscopy with brushings, endobronchial ultrasound with mediastinal lymph node aspirations, left thoracentesis.  SURGEON:  Revonda Standard. Roxan Hockey, M.D.  ASSISTANT:  None.  ANESTHESIA:  General.  FINDINGS:  Normal endobronchial anatomy.  No endobronchial lesions seen. Enlarged 4L and 7 nodes.  Multiple aspirations performed.  Quick prep revealed blood on one, bronchial epithelial cells on another. Additional specimens sent for cell block due to the need for tumor markers.  1.7 L of serous pleural fluid evacuated from the left chest.  CLINICAL NOTE:  Ms. Karen Adams is an 80 year old woman with likely stage IV lung cancer, but without a definitive diagnosis.  She had been receiving palliative radiation therapy.  She was having progressive shortness of breath and is now being considered for chemotherapy for palliative purposes.  She was advised to undergo bronchoscopy and endobronchial ultrasound to attempt to make a definitive diagnosis and obtain tissue for tumor markers.  On her preoperative chest x-ray on the day of surgery, she was found to have a large left pleural effusion occupying approximately two-thirds of the volume of her chest.  I discussed this with the patient and her family prior to surgery and recommended that we do a left thoracentesis while in the operating room, they agreed to that addition.  OPERATIVE NOTE:  Ms. [name] was brought to the operating  room on Jan 07, 2014.  She had induction of general anesthesia and was intubated. Flexible fiberoptic bronchoscopy was performed via the endotracheal tube.  No endobronchial lesions were seen to the level of subsegmental bronchi.  Next, the endobronchial ultrasound probe was advanced and a systematic inspection of the mediastinal node stations was carried out. There appeared to be reasonable target nodes in the 4L and 7 nodal stations.  Aspirations were performed.  Unfortunately where the window for the nodes was largest, we had difficulty advancing the needle likely due to some calcification in the bronchial and tracheal cartilages and in other areas where the needle passed easily, the windows were not as good and the first attempt resulted in a bloody tap.  Additional aspirations were performed from this 4L node and sent for a quick prep. Next, attention was turned to the level 7 node and multiple aspirations were performed.  Slides were created, but most of the specimens were placed into the [facility] for permanent pathology and markers.  The pathologist reviewed the initial slides and one had mostly blood and the other was mostly bronchial epithelial cells.  There was no definitive diagnosis.  Due to the need for markers, additional specimens were taken from the 7 and 4L nodes.  All of these were sent for permanent pathology.  After aspirating each  of the nodes multiple times, it was felt that additional sampling would not be beneficial.  The scope was withdrawn.  The regular bronchoscope was replaced.  There was minimal blood in the airways, which cleared with saline and suctioning, and there was no ongoing bleeding.  Next, the patient was placed with the left side slightly elevated while still on a supine position.  The lateral chest was prepped and draped in the usual sterile fashion.  Thoracentesis was performed of the left pleural space.  1.75 L of fluid was evacuated.  This fluid  was clear serous fluid.  All of the fluid was sent for cytology.  The patient then was extubated in the operating room and taken to the postanesthetic care unit in good condition.     Revonda Standard Roxan Hockey, M.D.     SCH/MEDQ  D:  01/07/2014  T:  01/08/2014  Job:  [id number]

## 2014-01-09 ENCOUNTER — Encounter (HOSPITAL_COMMUNITY): Payer: Self-pay | Admitting: Thoracic Surgery (Cardiothoracic Vascular Surgery)

## 2014-01-11 ENCOUNTER — Emergency Department (HOSPITAL_COMMUNITY): Payer: Medicare Other

## 2014-01-11 ENCOUNTER — Other Ambulatory Visit: Payer: Self-pay

## 2014-01-11 ENCOUNTER — Inpatient Hospital Stay (HOSPITAL_COMMUNITY)
Admission: EM | Admit: 2014-01-11 | Discharge: 2014-01-14 | DRG: 166 | Disposition: A | Discharge status: Home / Self Care | Payer: Medicare Other | Attending: Internal Medicine | Admitting: Internal Medicine

## 2014-01-11 ENCOUNTER — Encounter (HOSPITAL_COMMUNITY): Payer: Self-pay | Admitting: Emergency Medicine

## 2014-01-11 DIAGNOSIS — Z823 Family history of stroke: Secondary | ICD-10-CM

## 2014-01-11 DIAGNOSIS — J9600 Acute respiratory failure, unspecified whether with hypoxia or hypercapnia: Secondary | ICD-10-CM

## 2014-01-11 DIAGNOSIS — Z79899 Other long term (current) drug therapy: Secondary | ICD-10-CM

## 2014-01-11 DIAGNOSIS — J449 Chronic obstructive pulmonary disease, unspecified: Secondary | ICD-10-CM | POA: Diagnosis present

## 2014-01-11 DIAGNOSIS — J910 Malignant pleural effusion: Secondary | ICD-10-CM | POA: Diagnosis present

## 2014-01-11 DIAGNOSIS — J90 Pleural effusion, not elsewhere classified: Secondary | ICD-10-CM | POA: Diagnosis present

## 2014-01-11 DIAGNOSIS — I10 Essential (primary) hypertension: Secondary | ICD-10-CM | POA: Diagnosis present

## 2014-01-11 DIAGNOSIS — C3490 Malignant neoplasm of unspecified part of unspecified bronchus or lung: Principal | ICD-10-CM | POA: Diagnosis present

## 2014-01-11 DIAGNOSIS — C7951 Secondary malignant neoplasm of bone: Secondary | ICD-10-CM | POA: Diagnosis present

## 2014-01-11 DIAGNOSIS — I69998 Other sequelae following unspecified cerebrovascular disease: Secondary | ICD-10-CM

## 2014-01-11 DIAGNOSIS — R413 Other amnesia: Secondary | ICD-10-CM | POA: Diagnosis present

## 2014-01-11 DIAGNOSIS — I719 Aortic aneurysm of unspecified site, without rupture: Secondary | ICD-10-CM | POA: Diagnosis present

## 2014-01-11 DIAGNOSIS — Z85118 Personal history of other malignant neoplasm of bronchus and lung: Secondary | ICD-10-CM

## 2014-01-11 DIAGNOSIS — H3530 Unspecified macular degeneration: Secondary | ICD-10-CM | POA: Diagnosis present

## 2014-01-11 DIAGNOSIS — I4891 Unspecified atrial fibrillation: Secondary | ICD-10-CM | POA: Diagnosis present

## 2014-01-11 DIAGNOSIS — Z87891 Personal history of nicotine dependence: Secondary | ICD-10-CM

## 2014-01-11 DIAGNOSIS — F0390 Unspecified dementia without behavioral disturbance: Secondary | ICD-10-CM | POA: Diagnosis present

## 2014-01-11 DIAGNOSIS — Z961 Presence of intraocular lens: Secondary | ICD-10-CM

## 2014-01-11 DIAGNOSIS — F3289 Other specified depressive episodes: Secondary | ICD-10-CM | POA: Diagnosis present

## 2014-01-11 DIAGNOSIS — D649 Anemia, unspecified: Secondary | ICD-10-CM | POA: Diagnosis present

## 2014-01-11 DIAGNOSIS — F329 Major depressive disorder, single episode, unspecified: Secondary | ICD-10-CM | POA: Diagnosis present

## 2014-01-11 DIAGNOSIS — J9620 Acute and chronic respiratory failure, unspecified whether with hypoxia or hypercapnia: Secondary | ICD-10-CM | POA: Diagnosis present

## 2014-01-11 DIAGNOSIS — E785 Hyperlipidemia, unspecified: Secondary | ICD-10-CM | POA: Diagnosis present

## 2014-01-11 DIAGNOSIS — IMO0002 Reserved for concepts with insufficient information to code with codable children: Secondary | ICD-10-CM

## 2014-01-11 DIAGNOSIS — Z9981 Dependence on supplemental oxygen: Secondary | ICD-10-CM

## 2014-01-11 DIAGNOSIS — J4489 Other specified chronic obstructive pulmonary disease: Secondary | ICD-10-CM | POA: Diagnosis present

## 2014-01-11 DIAGNOSIS — C7952 Secondary malignant neoplasm of bone marrow: Secondary | ICD-10-CM

## 2014-01-11 LAB — CBC WITH DIFFERENTIAL/PLATELET
BASOS ABS: 0 10*3/uL (ref 0.0–0.1)
BASOS PCT: 0 % (ref 0–1)
EOS ABS: 0.8 10*3/uL — AB (ref 0.0–0.7)
EOS PCT: 7 % — AB (ref 0–5)
HCT: 40.1 % (ref 36.0–46.0)
Hemoglobin: 12.2 g/dL (ref 12.0–15.0)
LYMPHS ABS: 1 10*3/uL (ref 0.7–4.0)
Lymphocytes Relative: 8 % — ABNORMAL LOW (ref 12–46)
MCH: 27.9 pg (ref 26.0–34.0)
MCHC: 30.4 g/dL (ref 30.0–36.0)
MCV: 91.8 fL (ref 78.0–100.0)
Monocytes Absolute: 1.1 10*3/uL — ABNORMAL HIGH (ref 0.1–1.0)
Monocytes Relative: 9 % (ref 3–12)
NEUTROS PCT: 77 % (ref 43–77)
Neutro Abs: 9.6 10*3/uL — ABNORMAL HIGH (ref 1.7–7.7)
PLATELETS: 292 10*3/uL (ref 150–400)
RBC: 4.37 MIL/uL (ref 3.87–5.11)
RDW: 15 % (ref 11.5–15.5)
WBC: 12.5 10*3/uL — ABNORMAL HIGH (ref 4.0–10.5)

## 2014-01-11 LAB — BASIC METABOLIC PANEL
BUN: 16 mg/dL (ref 6–23)
CO2: 36 mEq/L — ABNORMAL HIGH (ref 19–32)
CREATININE: 0.52 mg/dL (ref 0.50–1.10)
Calcium: 9.2 mg/dL (ref 8.4–10.5)
Chloride: 96 mEq/L (ref 96–112)
GFR calc Af Amer: >90 mL/min (ref >90)
GFR calc non Af Amer: 87 mL/min — ABNORMAL LOW (ref >90)
Glucose, Bld: 117 mg/dL — ABNORMAL HIGH (ref 70–99)
POTASSIUM: 3.8 meq/L (ref 3.7–5.3)
SODIUM: 141 meq/L (ref 137–147)

## 2014-01-11 LAB — I-STAT CG4 LACTIC ACID, ED: LACTIC ACID, VENOUS: 1.67 mmol/L (ref 0.5–2.2)

## 2014-01-11 LAB — I-STAT TROPONIN, ED: TROPONIN I, POC: 0 ng/mL (ref 0.00–0.08)

## 2014-01-11 LAB — PROTIME-INR
INR: 0.98 (ref 0.00–1.49)
Prothrombin Time: 12.8 seconds (ref 11.6–15.2)

## 2014-01-11 MED ORDER — BIOTENE DRY MOUTH MT LIQD
15.0000 mL | Freq: Two times a day (BID) | OROMUCOSAL | Status: DC
  Administered 2014-01-11 – 2014-01-14 (×6): 15 mL via OROMUCOSAL

## 2014-01-11 MED ORDER — SODIUM CHLORIDE 0.9 % IJ SOLN: 3.0000 mL | INTRAMUSCULAR | Status: DC | PRN

## 2014-01-11 MED ORDER — HEPARIN SODIUM (PORCINE) 5000 UNIT/ML IJ SOLN
5000.0000 [IU] | Freq: Three times a day (TID) | INTRAMUSCULAR | Status: DC
  Administered 2014-01-11 – 2014-01-14 (×7): 5000 [IU] via SUBCUTANEOUS
  Filled 2014-01-11 (×11): qty 1

## 2014-01-11 MED ORDER — ALBUTEROL SULFATE (2.5 MG/3ML) 0.083% IN NEBU: 2.5000 mg | INHALATION_SOLUTION | RESPIRATORY_TRACT | Status: DC | PRN

## 2014-01-11 MED ORDER — NIFEDIPINE ER 30 MG PO TB24
30.0000 mg | ORAL_TABLET | Freq: Every day | ORAL | Status: DC
  Administered 2014-01-11 – 2014-01-14 (×4): 30 mg via ORAL
  Filled 2014-01-11 (×4): qty 1

## 2014-01-11 MED ORDER — PANTOPRAZOLE SODIUM 40 MG PO TBEC
40.0000 mg | DELAYED_RELEASE_TABLET | Freq: Every day | ORAL | Status: DC
  Administered 2014-01-11 – 2014-01-14 (×4): 40 mg via ORAL
  Filled 2014-01-11 (×3): qty 1

## 2014-01-11 MED ORDER — SENNOSIDES-DOCUSATE SODIUM 8.6-50 MG PO TABS: 1.0000 | ORAL_TABLET | Freq: Every evening | ORAL | Status: DC | PRN

## 2014-01-11 MED ORDER — ONDANSETRON HCL 4 MG PO TABS: 4.0000 mg | ORAL_TABLET | Freq: Four times a day (QID) | ORAL | Status: DC | PRN

## 2014-01-11 MED ORDER — OXYCODONE HCL 5 MG PO TABS
10.0000 mg | ORAL_TABLET | ORAL | Status: DC | PRN
  Administered 2014-01-13 – 2014-01-14 (×3): 10 mg via ORAL
  Filled 2014-01-11 (×3): qty 2

## 2014-01-11 MED ORDER — OXYCODONE HCL ER 10 MG PO T12A
10.0000 mg | EXTENDED_RELEASE_TABLET | Freq: Two times a day (BID) | ORAL | Status: DC
  Administered 2014-01-11 – 2014-01-14 (×6): 10 mg via ORAL
  Filled 2014-01-11 (×6): qty 1

## 2014-01-11 MED ORDER — ATORVASTATIN CALCIUM 10 MG PO TABS
10.0000 mg | ORAL_TABLET | Freq: Every day | ORAL | Status: DC
  Administered 2014-01-11 – 2014-01-14 (×4): 10 mg via ORAL
  Filled 2014-01-11 (×4): qty 1

## 2014-01-11 MED ORDER — DEXAMETHASONE 4 MG PO TABS
4.0000 mg | ORAL_TABLET | Freq: Every day | ORAL | Status: DC
  Administered 2014-01-11 – 2014-01-14 (×4): 4 mg via ORAL
  Filled 2014-01-11 (×4): qty 1

## 2014-01-11 MED ORDER — MORPHINE SULFATE 2 MG/ML IJ SOLN
0.5000 mg | INTRAMUSCULAR | Status: DC | PRN
  Administered 2014-01-11 – 2014-01-13 (×5): 0.5 mg via INTRAVENOUS
  Filled 2014-01-11 (×6): qty 1

## 2014-01-11 MED ORDER — ONDANSETRON HCL 4 MG/2ML IJ SOLN: 4.0000 mg | Freq: Four times a day (QID) | INTRAMUSCULAR | Status: DC | PRN

## 2014-01-11 MED ORDER — METOPROLOL TARTRATE 12.5 MG HALF TABLET
12.5000 mg | ORAL_TABLET | Freq: Two times a day (BID) | ORAL | Status: DC
  Administered 2014-01-11 – 2014-01-14 (×6): 12.5 mg via ORAL
  Filled 2014-01-11 (×7): qty 1

## 2014-01-11 MED ORDER — ACETAMINOPHEN 650 MG RE SUPP: 650.0000 mg | Freq: Four times a day (QID) | RECTAL | Status: DC | PRN

## 2014-01-11 MED ORDER — DONEPEZIL HCL 10 MG PO TABS
10.0000 mg | ORAL_TABLET | Freq: Every day | ORAL | Status: DC
  Administered 2014-01-11 – 2014-01-13 (×3): 10 mg via ORAL
  Filled 2014-01-11 (×4): qty 1

## 2014-01-11 MED ORDER — ALBUTEROL SULFATE (2.5 MG/3ML) 0.083% IN NEBU
2.5000 mg | INHALATION_SOLUTION | RESPIRATORY_TRACT | Status: DC | PRN
  Administered 2014-01-11 – 2014-01-13 (×5): 2.5 mg via RESPIRATORY_TRACT
  Filled 2014-01-11 (×6): qty 3

## 2014-01-11 MED ORDER — SERTRALINE HCL 100 MG PO TABS
100.0000 mg | ORAL_TABLET | Freq: Every day | ORAL | Status: DC
  Administered 2014-01-11 – 2014-01-14 (×4): 100 mg via ORAL
  Filled 2014-01-11 (×4): qty 1

## 2014-01-11 MED ORDER — ACETAMINOPHEN 325 MG PO TABS: 650.0000 mg | ORAL_TABLET | Freq: Four times a day (QID) | ORAL | Status: DC | PRN

## 2014-01-11 MED ORDER — SODIUM CHLORIDE 0.9 % IJ SOLN
3.0000 mL | Freq: Two times a day (BID) | INTRAMUSCULAR | Status: DC
  Administered 2014-01-11 – 2014-01-14 (×5): 3 mL via INTRAVENOUS

## 2014-01-11 MED ORDER — LORAZEPAM 0.5 MG PO TABS
0.5000 mg | ORAL_TABLET | Freq: Three times a day (TID) | ORAL | Status: DC | PRN
  Administered 2014-01-12 – 2014-01-13 (×3): 0.5 mg via ORAL
  Filled 2014-01-11 (×3): qty 1

## 2014-01-11 MED ORDER — SODIUM CHLORIDE 0.9 % IJ SOLN
3.0000 mL | Freq: Two times a day (BID) | INTRAMUSCULAR | Status: DC
  Administered 2014-01-11 – 2014-01-13 (×3): 3 mL via INTRAVENOUS

## 2014-01-11 MED ORDER — SODIUM CHLORIDE 0.9 % IV SOLN: 250.0000 mL | INTRAVENOUS | Status: DC | PRN

## 2014-01-11 NOTE — ED Notes (Signed)
Pt from home c/o SOB that has been increasingly worse the past 4 days after a thoracentesis. Pt has increased swelling to bilateral extremities. Pt had 4 nebs at home with no relief. EMS reports upon arrival on scene, pt was in resp distress, O2 87%. Pt was given neb with mild relief. Pt then started to c/o SOB again and  had O2 desat again. Pt was placed on CPAP by EMS. Pt then had relief with breathing with O2 sats at 100%. Pt has hx of CA lung mass, pleural effusions, COPD. Pt is A&O.

## 2014-01-11 NOTE — ED Provider Notes (Signed)
CSN: [id number]     Arrival date & time 01/11/14  1436 History   First MD Initiated Contact with Patient 01/11/14 1457     Chief Complaint  Patient presents with  . Respiratory Distress      HPI  Pt presents via EMS with Dyspnea and hypoxemia.  Pt is 4 days s/p Bronchoscophy and thoracentesis with Dr. Roxan Hockey with CT surgery.  Recent history of Lt Lung ca undergoing Palliative RTx.  Bipsy for tissue dx done to guide ?CTx.  Had improvement in dyapnea for 24 hours, but progressive dyspnea since.  + Cough, + phlegm, -fever/chills/chest pain.  Oncologist is Dr. Aundra Dubin.  Past Medical History  Diagnosis Date  . COPD (chronic obstructive pulmonary disease)   . A-fib   . Stroke     memory loss  . Dysrhythmia     A-Fibrillation  . Shortness of breath   . Arthritis   . Nausea   . Cancer     lung,pancreas, bone-new diagnosis  . Macular degeneration   . Hypertension     no longer being treated for this  . Aortic aneurysm     Followed by Dr. Servando Snare, very small (per daughter)  . Asthma   . On home oxygen therapy     2 L all the time  . Depression   . Complication of anesthesia   . PONV (postoperative nausea and vomiting)     "a little"  . History of shingles    Past Surgical History  Procedure Laterality Date  . Cholecystectomy    . Abdominal hysterectomy    . Eye surgery      bil. cataracts with lens implants  . Thoracentesis  12/03/13  . Video bronchoscopy with endobronchial ultrasound N/A 01/07/2014    Procedure: VIDEO BRONCHOSCOPY WITH ENDOBRONCHIAL ULTRASOUND;  Surgeon: Melrose Nakayama, MD;  Location: [facility];  Service: Thoracic;  Laterality: N/A;  . Thoracentesis Left 01/07/2014    Procedure: THORACENTESIS;  Surgeon: Melrose Nakayama, MD;  Location: [facility] OR;  Service: Thoracic;  Laterality: Left;   Family History  Problem Relation Age of Onset  . Stroke Mother   . Stroke Father   . Cancer Sister    History  Substance Use Topics  . Smoking status: Former  Smoker -- 0.50 packs/day for 20 years    Types: Cigarettes    Quit date: 01/18/1979  . Smokeless tobacco: Never Used  . Alcohol Use: No   OB History   Grav Para Term Preterm Abortions TAB SAB Ect Mult Living                 Review of Systems  Constitutional: Positive for appetite change and fatigue. Negative for fever, chills and diaphoresis.  HENT: Positive for congestion. Negative for mouth sores, sore throat and trouble swallowing.   Eyes: Negative for visual disturbance.  Respiratory: Positive for cough, shortness of breath and wheezing. Negative for chest tightness and stridor.        +DOE with any exertion at home.  Cardiovascular: Negative for chest pain.  Gastrointestinal: Negative for nausea, vomiting, abdominal pain, diarrhea and abdominal distention.  Endocrine: Negative for polydipsia, polyphagia and polyuria.  Genitourinary: Negative for dysuria, frequency and hematuria.  Musculoskeletal: Negative for gait problem.  Skin: Negative for color change, pallor and rash.  Neurological: Positive for weakness. Negative for dizziness, syncope, light-headedness and headaches.  Hematological: Does not bruise/bleed easily.  Psychiatric/Behavioral: Negative for behavioral problems and confusion.      Allergies  Kiwi extract; Pineapple; Shellfish allergy; and Latex  Home Medications   Prior to Admission medications   Medication Sig Start Date End Date Taking? Authorizing Provider  albuterol (PROVENTIL) (2.5 MG/3ML) 0.083% nebulizer solution Take 2.5 mg by nebulization every 3 (three) hours as needed for wheezing or shortness of breath.   Yes Historical Provider, MD  atorvastatin (LIPITOR) 10 MG tablet Take 1 tablet (10 mg total) by mouth daily. 11/20/13  Yes Robbie Lis, MD  dexamethasone (DECADRON) 4 MG tablet Take 4 mg by mouth daily.  12/17/13  Yes Historical Provider, MD  donepezil (ARICEPT) 10 MG tablet Take 1 tablet (10 mg total) by mouth at bedtime. 11/20/13  Yes Robbie Lis, MD  folic acid (FOLVITE) 1 MG tablet Take 1 tablet (1 mg total) by mouth daily. 11/20/13  Yes Robbie Lis, MD  gabapentin (NEURONTIN) 600 MG tablet Take 1,200 mg by mouth at bedtime.   Yes Historical Provider, MD  hyaluronate sodium (RADIAPLEXRX) GEL Apply 1 application topically 2 (two) times daily as needed. Apply to affected area after radiation treatments 12/19/13  Yes Thea Silversmith, MD  lidocaine (LIDODERM) 5 % Place 1 patch onto the skin daily as needed (Pain. Applied to back). Remove & Discard patch within 12 hours or as directed by MD   Yes Historical Provider, MD  LORazepam (ATIVAN) 0.5 MG tablet Take 0.5 mg by mouth every 8 (eight) hours as needed for anxiety or sleep.    Yes Historical Provider, MD  metoprolol tartrate (LOPRESSOR) 25 MG tablet Take 0.5 tablets (12.5 mg total) by mouth 2 (two) times daily. 11/20/13  Yes Robbie Lis, MD  NIFEdipine (PROCARDIA-XL/ADALAT CC) 30 MG 24 hr tablet Take 1 tablet (30 mg total) by mouth daily. 11/20/13  Yes Robbie Lis, MD  ondansetron (ZOFRAN) 4 MG tablet Take 1 tablet (4 mg total) by mouth every 8 (eight) hours as needed for nausea or vomiting. 12/04/13 12/04/14 Yes Rigoberto Noel, MD  oxycodone (OXY-IR) 5 MG capsule Take 10 mg by mouth every 4 (four) hours as needed for pain.    Yes Historical Provider, MD  OxyCODONE (OXYCONTIN) 10 mg T12A 12 hr tablet Take 10 mg by mouth every 12 (twelve) hours.   Yes Historical Provider, MD  pantoprazole (PROTONIX) 40 MG tablet Take 40 mg by mouth daily.  12/17/13  Yes Historical Provider, MD  sertraline (ZOLOFT) 100 MG tablet Take 1 tablet (100 mg total) by mouth daily. 11/20/13  Yes Robbie Lis, MD   BP 133/81  Pulse 104  Temp(Src) 97.8 F (36.6 C) (Oral)  Resp 24  SpO2 95% Physical Exam  Constitutional: She is oriented to person, place, and time.  Thin, frail appearing  HENT:  Head: Normocephalic.  Conjunctiva not pale.  Eyes: Conjunctivae are normal. Pupils are equal, round, and reactive to  light. No scleral icterus.  Neck: Normal range of motion. Neck supple. No thyromegaly present.  Cardiovascular: Normal rate and regular rhythm.  Exam reveals no gallop and no friction rub.   No murmur heard. Pulmonary/Chest: Effort normal and breath sounds normal. No respiratory distress. She has no wheezes. She has no rales.  Decreased Lt basilar breath sounds with dullness to mid scapula.  Abdominal: Soft. Bowel sounds are normal. She exhibits no distension. There is no tenderness. There is no rebound.  Musculoskeletal: Normal range of motion.  Neurological: She is alert and oriented to person, place, and time.  Skin: Skin is warm and dry. No rash noted.  1+BLE edema  Psychiatric: She has a normal mood and affect. Her behavior is normal.    ED Course  Procedures (including critical care time) Labs Review Labs Reviewed  CBC WITH DIFFERENTIAL - Abnormal; Notable for the following:    WBC 12.5 (*)    Neutro Abs 9.6 (*)    Lymphocytes Relative 8 (*)    Monocytes Absolute 1.1 (*)    Eosinophils Relative 7 (*)    Eosinophils Absolute 0.8 (*)    All other components within normal limits  BASIC METABOLIC PANEL  PROTIME-INR  I-STAT CG4 LACTIC ACID, ED  Randolm Idol, ED    Imaging Review Dg Chest Port 1 View  01/11/2014   CLINICAL DATA:  Respiratory distress. Lung carcinoma. COPD. Hypertension.  EXAM: PORTABLE CHEST - 1 VIEW  COMPARISON:  01/07/2014  FINDINGS: Increased size of left subpulmonic pleural effusion seen compared to previous study. Increased atelectasis seen in the left lung base.  Ill-defined pulmonary nodules in the right lower lung are again seen, consistent with pulmonary metastases. Cardiomegaly remains stable. Mediastinal lymphadenopathy in the aortopulmonary window appears stable.  IMPRESSION: Increased left subpulmonic pleural effusion and left basilar atelectasis.  No significant change in mediastinal lymphadenopathy in the AP window and multiple right lung nodules  consistent with pulmonary metastases.  Stable cardiomegaly.   Electronically Signed   By: Earle Gell M.D.   On: 01/11/2014 15:24     EKG Interpretation   Date/Time:  Sunday Jan 11 2014 14:43:47 EDT Ventricular Rate:  104 PR Interval:  100 QRS Duration: 92 QT Interval:  322 QTC Calculation: 423 R Axis:   63 Text Interpretation:  Sinus tachycardia with irregular rate PACs Confirmed  by Jeneen Rinks  MD, [place] ([id number]) on 01/11/2014 3:40:40 PM      MDM   Final diagnoses:  None   Pt discussed with Dr. Cyndia Bent of CT Surgery.  At this time it is thought that she will likely need Pleurex catheter for her recurrent effusion.  She has some wheezing on exam. She did improve with albuterol en route. However, think most of her dyspnea is due to her recurrent effusion. Discussed case with Dr. Jerilee Hoh of [facility]. Patient will be admitted. Plan for consultations radiology for Rx catheter placement for recurrent thoracentesis.    Tanna Furry, MD 01/11/14 [phone]

## 2014-01-11 NOTE — H&P (Signed)
[facility]          History and Physical    PCP:   Truitt Merle, NP   Chief Complaint:  Shortness of breath  HPI: Patient is an 80 year old woman with past medical history significant for dementia, hyperlipidemia, atrial fibrillation not on anticoagulation, history of hypertension who was recently diagnosed with a lung mass in January when she presented to [facility] with respiratory distress. History is mostly provided by her daughter. She had a CT scan that showed a lung mass but bronchoscopy at that time was nondiagnostic. She had a thoracentesis that showed atypical cells but no definitive cancer. She then developed back pain. Workup including CT angiogram and head showed what appeared to be metastatic lung cancer. She started palliative radiation to her spine that has helped significantly with her pain. On 01/08/2014 she had a repeat bronchoscopy with Dr. Roxan Hockey as well as a thoracentesis with removal of 1700 cc of fluid. She had been doing well for 2 days post biopsy, however she had increased shortness of breath that prompted her daughter to bring her to the hospital today. Chest x-ray shows reaccumulation of the left-sided pleural effusion. CT surgery as well as interventional radiology have been consulted by EDP. Recommendations have been made for a Pleurx catheter for her recurrent malignant pleural effusion which will be done in the morning. Hospitalist admission has been requested.  Allergies:   Allergies  Allergen Reactions  . Kiwi Extract Itching  . Pineapple Itching  . Shellfish Allergy Other (See Comments)    Family is unsure  . Latex Itching and Rash      Past Medical History  Diagnosis Date  . COPD (chronic obstructive pulmonary disease)   . A-fib   . Stroke     memory loss  . Dysrhythmia     A-Fibrillation  . Shortness of breath   . Arthritis   . Nausea   . Cancer     lung,pancreas, bone-new diagnosis  . Macular  degeneration   . Hypertension     no longer being treated for this  . Aortic aneurysm     Followed by Dr. Servando Snare, very small (per daughter)  . Asthma   . On home oxygen therapy     2 L all the time  . Depression   . Complication of anesthesia   . PONV (postoperative nausea and vomiting)     "a little"  . History of shingles     Past Surgical History  Procedure Laterality Date  . Cholecystectomy    . Abdominal hysterectomy    . Eye surgery      bil. cataracts with lens implants  . Thoracentesis  12/03/13  . Video bronchoscopy with endobronchial ultrasound N/A 01/07/2014    Procedure: VIDEO BRONCHOSCOPY WITH ENDOBRONCHIAL ULTRASOUND;  Surgeon: Melrose Nakayama, MD;  Location: [facility];  Service: Thoracic;  Laterality: N/A;  . Thoracentesis Left 01/07/2014    Procedure: THORACENTESIS;  Surgeon: Melrose Nakayama, MD;  Location: [facility];  Service: Thoracic;  Laterality: Left;    Prior to Admission medications   Medication Sig Start Date End Date Taking? Authorizing Provider  albuterol (PROVENTIL) (2.5 MG/3ML) 0.083% nebulizer solution Take 2.5 mg by nebulization every 3 (three) hours as needed for wheezing or shortness of breath.   Yes Historical Provider, MD  atorvastatin (LIPITOR) 10 MG tablet Take 1 tablet (10 mg total) by mouth  daily. 11/20/13  Yes Robbie Lis, MD  dexamethasone (DECADRON) 4 MG tablet Take 4 mg by mouth daily.  12/17/13  Yes Historical Provider, MD  donepezil (ARICEPT) 10 MG tablet Take 1 tablet (10 mg total) by mouth at bedtime. 11/20/13  Yes Robbie Lis, MD  folic acid (FOLVITE) 1 MG tablet Take 1 tablet (1 mg total) by mouth daily. 11/20/13  Yes Robbie Lis, MD  gabapentin (NEURONTIN) 600 MG tablet Take 1,200 mg by mouth at bedtime.   Yes Historical Provider, MD  hyaluronate sodium (RADIAPLEXRX) GEL Apply 1 application topically 2 (two) times daily as needed. Apply to affected area after radiation treatments 12/19/13  Yes Thea Silversmith, MD  lidocaine  (LIDODERM) 5 % Place 1 patch onto the skin daily as needed (Pain. Applied to back). Remove & Discard patch within 12 hours or as directed by MD   Yes Historical Provider, MD  LORazepam (ATIVAN) 0.5 MG tablet Take 0.5 mg by mouth every 8 (eight) hours as needed for anxiety or sleep.    Yes Historical Provider, MD  metoprolol tartrate (LOPRESSOR) 25 MG tablet Take 0.5 tablets (12.5 mg total) by mouth 2 (two) times daily. 11/20/13  Yes Robbie Lis, MD  NIFEdipine (PROCARDIA-XL/ADALAT CC) 30 MG 24 hr tablet Take 1 tablet (30 mg total) by mouth daily. 11/20/13  Yes Robbie Lis, MD  ondansetron (ZOFRAN) 4 MG tablet Take 1 tablet (4 mg total) by mouth every 8 (eight) hours as needed for nausea or vomiting. 12/04/13 12/04/14 Yes Rigoberto Noel, MD  oxycodone (OXY-IR) 5 MG capsule Take 10 mg by mouth every 4 (four) hours as needed for pain.    Yes Historical Provider, MD  OxyCODONE (OXYCONTIN) 10 mg T12A 12 hr tablet Take 10 mg by mouth every 12 (twelve) hours.   Yes Historical Provider, MD  pantoprazole (PROTONIX) 40 MG tablet Take 40 mg by mouth daily.  12/17/13  Yes Historical Provider, MD  sertraline (ZOLOFT) 100 MG tablet Take 1 tablet (100 mg total) by mouth daily. 11/20/13  Yes Robbie Lis, MD    Social History:  reports that she quit smoking about 35 years ago. Her smoking use included Cigarettes. She has a 10 pack-year smoking history. She has never used smokeless tobacco. She reports that she does not drink alcohol or use illicit drugs.  Family History  Problem Relation Age of Onset  . Stroke Mother   . Stroke Father   . Cancer Sister     Review of Systems:  Constitutional: Denies fever, chills, diaphoresis. HEENT: Denies photophobia, eye pain, redness, hearing loss, ear pain, congestion, sore throat, rhinorrhea, sneezing, mouth sores, trouble swallowing, neck pain, neck stiffness and tinnitus.   Respiratory: Positive for SOB, DOE, cough, chest tightness,  and wheezing.   Cardiovascular:  Denies chest pain, palpitations and leg swelling.  Gastrointestinal: Denies nausea, vomiting, abdominal pain, diarrhea, constipation, blood in stool and abdominal distention.  Genitourinary: Denies dysuria, urgency, frequency, hematuria, flank pain and difficulty urinating.  Endocrine: Denies: hot or cold intolerance, sweats, changes in hair or nails, polyuria, polydipsia. Musculoskeletal: Denies myalgias,  joint swelling, arthralgias and gait problem.  Skin: Denies pallor, rash and wound.  Neurological: Denies dizziness, seizures, syncope, weakness, light-headedness, numbness and headaches.  Hematological: Denies adenopathy. Easy bruising, personal or family bleeding history  Psychiatric/Behavioral: Denies suicidal ideation, mood changes, confusion, nervousness, sleep disturbance and agitation   Physical Exam: Blood pressure 153/49, pulse 106, temperature 98.1 F (36.7 C), temperature source Oral, resp.  rate 22, height 5' (1.524 m), weight 52 kg (114 lb 10.2 oz), SpO2 97.00%. General: Alert, awake, states she is having difficulty breathing. HEENT: Normocephalic, atraumatic, moist mucous membranes, PERRL Neck: Supple, no JVD adenopathy no goiter no bruits. Cardiovascular: Tachycardic, irregular, no murmurs auscultated. Lungs: Very diminished breath sounds left lower and mid lung fields, mild expiratory wheezes. Abdomen: Soft, nondistended, nontender. Extremities: 2-3+ pitting edema up to mid shins bilaterally, positive pulses. Neurologic:non-focal.  Labs on Admission:  Results for orders placed during the hospital encounter of 01/11/14 (from the past 48 hour(s))  CBC WITH DIFFERENTIAL     Status: Abnormal   Collection Time    01/11/14  3:20 PM      Result Value Ref Range   WBC 12.5 (*) 4.0 - 10.5 K/uL   RBC 4.37  3.87 - 5.11 MIL/uL   Hemoglobin 12.2  12.0 - 15.0 g/dL   HCT 40.1  36.0 - 46.0 %   MCV 91.8  78.0 - 100.0 fL   MCH 27.9  26.0 - 34.0 pg   MCHC 30.4  30.0 - 36.0 g/dL    RDW 15.0  11.5 - 15.5 %   Platelets 292  150 - 400 K/uL   Neutrophils Relative % 77  43 - 77 %   Neutro Abs 9.6 (*) 1.7 - 7.7 K/uL   Lymphocytes Relative 8 (*) 12 - 46 %   Lymphs Abs 1.0  0.7 - 4.0 K/uL   Monocytes Relative 9  3 - 12 %   Monocytes Absolute 1.1 (*) 0.1 - 1.0 K/uL   Eosinophils Relative 7 (*) 0 - 5 %   Eosinophils Absolute 0.8 (*) 0.0 - 0.7 K/uL   Basophils Relative 0  0 - 1 %   Basophils Absolute 0.0  0.0 - 0.1 K/uL  BASIC METABOLIC PANEL     Status: Abnormal   Collection Time    01/11/14  3:20 PM      Result Value Ref Range   Sodium 141  137 - 147 mEq/L   Potassium 3.8  3.7 - 5.3 mEq/L   Chloride 96  96 - 112 mEq/L   CO2 36 (*) 19 - 32 mEq/L   Glucose, Bld 117 (*) 70 - 99 mg/dL   BUN 16  6 - 23 mg/dL   Creatinine, Ser 0.52  0.50 - 1.10 mg/dL   Calcium 9.2  8.4 - 10.5 mg/dL   GFR calc non Af Amer 87 (*) >90 mL/min   GFR calc Af Amer >90  >90 mL/min   Comment: (NOTE)     The eGFR has been calculated using the CKD EPI equation.     This calculation has not been validated in all clinical situations.     eGFR's persistently <90 mL/min signify possible Chronic Kidney     Disease.  PROTIME-INR     Status: None   Collection Time    01/11/14  3:20 PM      Result Value Ref Range   Prothrombin Time 12.8  11.6 - 15.2 seconds   INR 0.98  0.00 - 1.49  I-STAT TROPOININ, ED     Status: None   Collection Time    01/11/14  3:34 PM      Result Value Ref Range   Troponin i, poc 0.00  0.00 - 0.08 ng/mL   Comment 3            Comment: Due to the release kinetics of cTnI,     a negative result  within the first hours     of the onset of symptoms does not rule out     myocardial infarction with certainty.     If myocardial infarction is still suspected,     repeat the test at appropriate intervals.  I-STAT CG4 LACTIC ACID, ED     Status: None   Collection Time    01/11/14  3:36 PM      Result Value Ref Range   Lactic Acid, Venous 1.67  0.5 - 2.2 mmol/L    Radiological  Exams on Admission: Dg Chest Port 1 View  01/11/2014   CLINICAL DATA:  Respiratory distress. Lung carcinoma. COPD. Hypertension.  EXAM: PORTABLE CHEST - 1 VIEW  COMPARISON:  01/07/2014  FINDINGS: Increased size of left subpulmonic pleural effusion seen compared to previous study. Increased atelectasis seen in the left lung base.  Ill-defined pulmonary nodules in the right lower lung are again seen, consistent with pulmonary metastases. Cardiomegaly remains stable. Mediastinal lymphadenopathy in the aortopulmonary window appears stable.  IMPRESSION: Increased left subpulmonic pleural effusion and left basilar atelectasis.  No significant change in mediastinal lymphadenopathy in the AP window and multiple right lung nodules consistent with pulmonary metastases.  Stable cardiomegaly.   Electronically Signed   By: Earle Gell M.D.   On: 01/11/2014 15:24    Assessment/Plan Principal Problem:   Acute respiratory failure Active Problems:   Pleural effusion   Lung cancer   A-fib    Acute hypoxemic respiratory failure  -Secondary to recurrent malignant left-sided pleural effusion. -Please see below for details. -Oxygen support as needed for comfort and to maintain sats above 92%, nebs as needed.  Malignant recurrent left-sided pleural effusion -CT surgery is recommending placement of a Pleurx catheter. -Will ask interventional radiology to perform this tomorrow.  Presumed stage IV lung cancer -I have reviewed pathology from recent bronchoscopy, there were some atypical cells but nothing definitive for malignancy. -This is her second tissue biopsy has failed to be diagnostic.  -Will add Dr. Earlie Server as a consultant in [facility].  History of atrial fibrillation -Continue metoprolol for rate control. -Not on anticoagulation.  DVT prophylaxis -Subcutaneous heparin  CODE STATUS -As discussed with patient's daughter, will remain full code for now.   Time Spent on Admission: 85 minutes  [facility] Pager: [phone] 01/11/2014, 5:53 PM

## 2014-01-11 NOTE — ED Notes (Signed)
Attempted to call report to floor 

## 2014-01-11 NOTE — ED Notes (Signed)
Pt from home c/o worsening SOB over past few days after a lung biopsy 4 days ago and drew 1700cc fluid from thoracentesis. Pt adds that she has a cough that is new. Pt denies pain at this time. Pt has inspiratory, expiratory wheezing and rhonchus bilaterally. Pt is A&O and in NAD. Pt is 95% on 3L [place].

## 2014-01-12 ENCOUNTER — Encounter (HOSPITAL_COMMUNITY): Payer: Self-pay | Admitting: Radiology

## 2014-01-12 ENCOUNTER — Other Ambulatory Visit: Payer: Self-pay | Admitting: Thoracic Surgery (Cardiothoracic Vascular Surgery)

## 2014-01-12 ENCOUNTER — Inpatient Hospital Stay (HOSPITAL_COMMUNITY): Payer: Medicare Other

## 2014-01-12 DIAGNOSIS — R918 Other nonspecific abnormal finding of lung field: Secondary | ICD-10-CM

## 2014-01-12 DIAGNOSIS — R0602 Shortness of breath: Secondary | ICD-10-CM

## 2014-01-12 DIAGNOSIS — J90 Pleural effusion, not elsewhere classified: Secondary | ICD-10-CM

## 2014-01-12 DIAGNOSIS — C7952 Secondary malignant neoplasm of bone marrow: Secondary | ICD-10-CM

## 2014-01-12 DIAGNOSIS — C3490 Malignant neoplasm of unspecified part of unspecified bronchus or lung: Secondary | ICD-10-CM

## 2014-01-12 DIAGNOSIS — C801 Malignant (primary) neoplasm, unspecified: Secondary | ICD-10-CM

## 2014-01-12 DIAGNOSIS — C7951 Secondary malignant neoplasm of bone: Secondary | ICD-10-CM

## 2014-01-12 DIAGNOSIS — C7889 Secondary malignant neoplasm of other digestive organs: Secondary | ICD-10-CM

## 2014-01-12 DIAGNOSIS — C771 Secondary and unspecified malignant neoplasm of intrathoracic lymph nodes: Secondary | ICD-10-CM

## 2014-01-12 LAB — BASIC METABOLIC PANEL
BUN: 16 mg/dL (ref 6–23)
CO2: 36 mEq/L — ABNORMAL HIGH (ref 19–32)
Calcium: 9.1 mg/dL (ref 8.4–10.5)
Chloride: 96 mEq/L (ref 96–112)
Creatinine, Ser: 0.54 mg/dL (ref 0.50–1.10)
GFR, EST NON AFRICAN AMERICAN: 86 mL/min — AB (ref >90)
Glucose, Bld: 135 mg/dL — ABNORMAL HIGH (ref 70–99)
POTASSIUM: 5.1 meq/L (ref 3.7–5.3)
SODIUM: 140 meq/L (ref 137–147)

## 2014-01-12 LAB — CBC
HCT: 38 % (ref 36.0–46.0)
Hemoglobin: 11.7 g/dL — ABNORMAL LOW (ref 12.0–15.0)
MCH: 28.2 pg (ref 26.0–34.0)
MCHC: 30.8 g/dL (ref 30.0–36.0)
MCV: 91.6 fL (ref 78.0–100.0)
Platelets: 297 10*3/uL (ref 150–400)
RBC: 4.15 MIL/uL (ref 3.87–5.11)
RDW: 15 % (ref 11.5–15.5)
WBC: 11.1 10*3/uL — ABNORMAL HIGH (ref 4.0–10.5)

## 2014-01-12 MED ORDER — LIDOCAINE HCL 1 % IJ SOLN
INTRAMUSCULAR | Status: AC
  Filled 2014-01-12: qty 20

## 2014-01-12 MED ORDER — FENTANYL CITRATE 0.05 MG/ML IJ SOLN
INTRAMUSCULAR | Status: AC
  Filled 2014-01-12: qty 6

## 2014-01-12 MED ORDER — MIDAZOLAM HCL 2 MG/2ML IJ SOLN
INTRAMUSCULAR | Status: AC
  Filled 2014-01-12: qty 6

## 2014-01-12 MED ORDER — CEFAZOLIN SODIUM-DEXTROSE 2-3 GM-% IV SOLR
2.0000 g | INTRAVENOUS | Status: AC
  Administered 2014-01-12: 2 g via INTRAVENOUS
  Filled 2014-01-12 (×3): qty 50

## 2014-01-12 MED ORDER — MIDAZOLAM HCL 2 MG/2ML IJ SOLN
INTRAMUSCULAR | Status: AC | PRN
  Administered 2014-01-12 (×2): 0.5 mg via INTRAVENOUS

## 2014-01-12 MED ORDER — FENTANYL CITRATE 0.05 MG/ML IJ SOLN
INTRAMUSCULAR | Status: AC | PRN
  Administered 2014-01-12: 25 ug via INTRAVENOUS

## 2014-01-12 NOTE — Consult Note (Signed)
CONSULT NOTE Physician requesting consult: Dr. Jerilee Hoh Consulting MD: Dr. Maryclare Bean Reason for consult: Recurrent left effusion, request PleurX catheter placement  HPI: Karen Adams is an 80 y.o. female with newly diagnosed lung mass. She is currently in process of workup and had recent EBUS and left thoracentesis. The path/cyto is atypical but not diagnostic for lung cancer, though it is presumed. She felt better after thoracentesis of 1.7L but had to be readmitted for shortness of breath. CXR finds recurrent moderate-lg pleural effusion on the left. It was recommended by CT surgery that she have a PleurX catheter placed. Chart, PMHx, meds reviewed. Pt received sub-q heparin this am, but has been NPO otherwise.  Past Medical History:  Past Medical History  Diagnosis Date  . COPD (chronic obstructive pulmonary disease)   . A-fib   . Stroke     memory loss  . Dysrhythmia     A-Fibrillation  . Shortness of breath   . Arthritis   . Nausea   . Cancer     lung,pancreas, bone-new diagnosis  . Macular degeneration   . Hypertension     no longer being treated for this  . Aortic aneurysm     Followed by Dr. Servando Snare, very small (per daughter)  . Asthma   . On home oxygen therapy     2 L all the time  . Depression   . Complication of anesthesia   . PONV (postoperative nausea and vomiting)     "a little"  . History of shingles     Past Surgical History:  Past Surgical History  Procedure Laterality Date  . Cholecystectomy    . Abdominal hysterectomy    . Eye surgery      bil. cataracts with lens implants  . Thoracentesis  12/03/13  . Video bronchoscopy with endobronchial ultrasound N/A 01/07/2014    Procedure: VIDEO BRONCHOSCOPY WITH ENDOBRONCHIAL ULTRASOUND;  Surgeon: Melrose Nakayama, MD;  Location: [place];  Service: Thoracic;  Laterality: N/A;  . Thoracentesis Left 01/07/2014    Procedure: THORACENTESIS;  Surgeon: Melrose Nakayama, MD;  Location: [facility] OR;  Service: Thoracic;  Laterality: Left;    Family History:  Family History  Problem Relation Age of Onset  . Stroke Mother   . Stroke Father   . Cancer Sister     Social History:  reports that she quit smoking about 35 years ago. Her smoking use included Cigarettes. She has a 10 pack-year smoking history. She has never used smokeless tobacco. She reports that she does not drink alcohol or use illicit drugs.  Allergies:  Allergies  Allergen Reactions  . Kiwi Extract Itching  . Pineapple Itching  . Shellfish Allergy Other (See Comments)    Family is unsure  . Latex Itching and Rash    Medications:   Medication List    ASK your doctor about these medications       albuterol (2.5 MG/3ML) 0.083% nebulizer solution  Commonly known as:  PROVENTIL  Take 2.5 mg by nebulization every 3 (three) hours as needed for wheezing or shortness of breath.     atorvastatin 10 MG tablet  Commonly known as:  LIPITOR  Take 1 tablet (10 mg total) by mouth daily.     dexamethasone 4 MG tablet  Commonly known as:  DECADRON  Take 4 mg by mouth daily.     donepezil 10 MG tablet  Commonly known as:  ARICEPT  Take 1 tablet (10 mg total) by mouth at bedtime.  folic acid 1 MG tablet  Commonly known as:  FOLVITE  Take 1 tablet (1 mg total) by mouth daily.     gabapentin 600 MG tablet  Commonly known as:  NEURONTIN  Take 1,200 mg by mouth at bedtime.     hyaluronate sodium Gel  Apply 1 application topically 2 (two) times daily as needed. Apply to affected area after radiation treatments     lidocaine 5 %  Commonly known as:  LIDODERM  Place 1 patch onto the skin daily as needed (Pain. Applied to back). Remove & Discard patch within 12 hours or as directed by MD     LORazepam 0.5 MG tablet  Commonly known as:  ATIVAN  Take 0.5 mg by mouth every 8 (eight) hours as needed for anxiety or sleep.     metoprolol tartrate 25 MG tablet  Commonly known as:   LOPRESSOR  Take 0.5 tablets (12.5 mg total) by mouth 2 (two) times daily.     NIFEdipine 30 MG 24 hr tablet  Commonly known as:  PROCARDIA-XL/ADALAT CC  Take 1 tablet (30 mg total) by mouth daily.     ondansetron 4 MG tablet  Commonly known as:  ZOFRAN  Take 1 tablet (4 mg total) by mouth every 8 (eight) hours as needed for nausea or vomiting.     OxyCODONE 10 mg T12a 12 hr tablet  Commonly known as:  OXYCONTIN  Take 10 mg by mouth every 12 (twelve) hours.     oxycodone 5 MG capsule  Commonly known as:  OXY-IR  Take 10 mg by mouth every 4 (four) hours as needed for pain.     pantoprazole 40 MG tablet  Commonly known as:  PROTONIX  Take 40 mg by mouth daily.     sertraline 100 MG tablet  Commonly known as:  ZOLOFT  Take 1 tablet (100 mg total) by mouth daily.        Please HPI for pertinent positives, otherwise complete 10 system ROS negative.  Physical Exam: BP 137/46  Pulse 82  Temp(Src) 98 F (36.7 C) (Oral)  Resp 20  Ht 5' (1.524 m)  Wt 114 lb 10.2 oz (52 kg)  BMI 22.39 kg/m2  SpO2 100% Body mass index is 22.39 kg/(m^2).   General Appearance:  Thin elderly female, NAD, on [place] O2  Head:  Normocephalic, without obvious abnormality, atraumatic  ENT: Unremarkable  Neck: Supple, symmetrical, trachea midline  Lungs:   Diminished BS on left, clear on right  Chest Wall:  No tenderness or deformity  Heart:  Regular rate and rhythm, S1, S2 normal, no murmur, rub or gallop.  Abdomen:   Soft, non-tender, non distended.  Neurologic: Normal affect, no gross deficits.   Results for orders placed during the hospital encounter of 01/11/14 (from the past 48 hour(s))  CBC WITH DIFFERENTIAL     Status: Abnormal   Collection Time    01/11/14  3:20 PM      Result Value Ref Range   WBC 12.5 (*) 4.0 - 10.5 K/uL   RBC 4.37  3.87 - 5.11 MIL/uL   Hemoglobin 12.2  12.0 - 15.0 g/dL   HCT 40.1  36.0 - 46.0 %   MCV 91.8  78.0 - 100.0 fL   MCH 27.9  26.0 - 34.0 pg   MCHC 30.4  30.0  - 36.0 g/dL   RDW 15.0  11.5 - 15.5 %   Platelets 292  150 - 400 K/uL   Neutrophils Relative %  77  43 - 77 %   Neutro Abs 9.6 (*) 1.7 - 7.7 K/uL   Lymphocytes Relative 8 (*) 12 - 46 %   Lymphs Abs 1.0  0.7 - 4.0 K/uL   Monocytes Relative 9  3 - 12 %   Monocytes Absolute 1.1 (*) 0.1 - 1.0 K/uL   Eosinophils Relative 7 (*) 0 - 5 %   Eosinophils Absolute 0.8 (*) 0.0 - 0.7 K/uL   Basophils Relative 0  0 - 1 %   Basophils Absolute 0.0  0.0 - 0.1 K/uL  BASIC METABOLIC PANEL     Status: Abnormal   Collection Time    01/11/14  3:20 PM      Result Value Ref Range   Sodium 141  137 - 147 mEq/L   Potassium 3.8  3.7 - 5.3 mEq/L   Chloride 96  96 - 112 mEq/L   CO2 36 (*) 19 - 32 mEq/L   Glucose, Bld 117 (*) 70 - 99 mg/dL   BUN 16  6 - 23 mg/dL   Creatinine, Ser 0.52  0.50 - 1.10 mg/dL   Calcium 9.2  8.4 - 10.5 mg/dL   GFR calc non Af Amer 87 (*) >90 mL/min   GFR calc Af Amer >90  >90 mL/min   Comment: (NOTE)     The eGFR has been calculated using the CKD EPI equation.     This calculation has not been validated in all clinical situations.     eGFR's persistently <90 mL/min signify possible Chronic Kidney     Disease.  PROTIME-INR     Status: None   Collection Time    01/11/14  3:20 PM      Result Value Ref Range   Prothrombin Time 12.8  11.6 - 15.2 seconds   INR 0.98  0.00 - 1.49  I-STAT TROPOININ, ED     Status: None   Collection Time    01/11/14  3:34 PM      Result Value Ref Range   Troponin i, poc 0.00  0.00 - 0.08 ng/mL   Comment 3            Comment: Due to the release kinetics of cTnI,     a negative result within the first hours     of the onset of symptoms does not rule out     myocardial infarction with certainty.     If myocardial infarction is still suspected,     repeat the test at appropriate intervals.  I-STAT CG4 LACTIC ACID, ED     Status: None   Collection Time    01/11/14  3:36 PM      Result Value Ref Range   Lactic Acid, Venous 1.67  0.5 - 2.2 mmol/L   BASIC METABOLIC PANEL     Status: Abnormal   Collection Time    01/12/14  4:10 AM      Result Value Ref Range   Sodium 140  137 - 147 mEq/L   Potassium 5.1  3.7 - 5.3 mEq/L   Comment: NO VISIBLE HEMOLYSIS     DELTA CHECK NOTED   Chloride 96  96 - 112 mEq/L   CO2 36 (*) 19 - 32 mEq/L   Glucose, Bld 135 (*) 70 - 99 mg/dL   BUN 16  6 - 23 mg/dL   Creatinine, Ser 0.54  0.50 - 1.10 mg/dL   Calcium 9.1  8.4 - 10.5 mg/dL   GFR calc non Af Wyvonnia Lora  86 (*) >90 mL/min   GFR calc Af Amer >90  >90 mL/min   Comment: (NOTE)     The eGFR has been calculated using the CKD EPI equation.     This calculation has not been validated in all clinical situations.     eGFR's persistently <90 mL/min signify possible Chronic Kidney     Disease.  CBC     Status: Abnormal   Collection Time    01/12/14  4:10 AM      Result Value Ref Range   WBC 11.1 (*) 4.0 - 10.5 K/uL   RBC 4.15  3.87 - 5.11 MIL/uL   Hemoglobin 11.7 (*) 12.0 - 15.0 g/dL   HCT 38.0  36.0 - 46.0 %   MCV 91.6  78.0 - 100.0 fL   MCH 28.2  26.0 - 34.0 pg   MCHC 30.8  30.0 - 36.0 g/dL   RDW 15.0  11.5 - 15.5 %   Platelets 297  150 - 400 K/uL   Dg Chest Port 1 View  01/11/2014   CLINICAL DATA:  Respiratory distress. Lung carcinoma. COPD. Hypertension.  EXAM: PORTABLE CHEST - 1 VIEW  COMPARISON:  01/07/2014  FINDINGS: Increased size of left subpulmonic pleural effusion seen compared to previous study. Increased atelectasis seen in the left lung base.  Ill-defined pulmonary nodules in the right lower lung are again seen, consistent with pulmonary metastases. Cardiomegaly remains stable. Mediastinal lymphadenopathy in the aortopulmonary window appears stable.  IMPRESSION: Increased left subpulmonic pleural effusion and left basilar atelectasis.  No significant change in mediastinal lymphadenopathy in the AP window and multiple right lung nodules consistent with pulmonary metastases.  Stable cardiomegaly.   Electronically Signed   By: Earle Gell M.D.    On: 01/11/2014 15:24    Assessment/Plan Recurrent left pleural effusion from metastatic lung cancer Discussed indication for pleural catheter placement. Explained procedure, risks, complications, use of sedation. Pt and daughter understand and agree, consent signed Labs reviewed.   Ascencion Dike PA-C 01/12/2014, 10:09 AM

## 2014-01-12 NOTE — Consult Note (Signed)
[place]  [phone]   Requesting Provider: [facility]  Consulting Provider: Dr.Claude Adams  Primary Oncologist: Karen Adams  NOTE  Reason for Consultation: Presumed  Metastatic Lung Cancer   HPI: Karen Adams is a pleasant 80 year old woman with a recently found lung mass seen  Recently at the [facility] by Karen Adams. She was undergoing diagnostic workup, including a recent EBUS and Left thoracentesis on 5/6 , which showed atypical cells but non diagnostic.  She was admitted on 5/10 with recurrent left moderate pleural effusion with subsequent shortness of breath. Interventional radiology to proceed with Plerux catheter placement to improve symptoms.  No new events overnight. Denies nausea, vomiting or diarrhea. No cardiac complaints. No confusion. Of note, MRI of the brain is remarkable for abnormal marrow signal in the clivus, suspicious for osseous metastatic disease.  Past Medical History  Diagnosis Date   COPD (chronic obstructive pulmonary disease)    A-fib    Stroke     memory loss   Dysrhythmia     A-Fibrillation   Shortness of breath    Arthritis    Nausea    Cancer     lung,pancreas, bone-new diagnosis   Macular degeneration    Hypertension     no longer being treated for this   Aortic aneurysm     Followed by Karen Adams, very small (per daughter)   Asthma    On home oxygen therapy     2 L all the time   Depression    Complication of anesthesia    PONV (postoperative nausea and vomiting)     "a little"   History of shingles      MEDICATIONS:  Scheduled Meds:  antiseptic oral rinse  15 mL Mouth Rinse BID   atorvastatin  10 mg Oral Daily    ceFAZolin (ANCEF) IV  2 g Intravenous On Call   dexamethasone  4 mg Oral Daily   donepezil  10 mg Oral QHS   heparin  5,000 Units Subcutaneous 3 times per day   metoprolol tartrate  12.5 mg Oral BID   NIFEdipine  30 mg Oral  Daily   OxyCODONE  10 mg Oral Q12H   pantoprazole  40 mg Oral Daily   sertraline  100 mg Oral Daily   sodium chloride  3 mL Intravenous Q12H   sodium chloride  3 mL Intravenous Q12H   Continuous Infusions:  PRN Meds:.sodium chloride, acetaminophen, acetaminophen, albuterol, LORazepam, morphine injection, ondansetron (ZOFRAN) IV, ondansetron, oxyCODONE, senna-docusate, sodium chloride  ALLERGIES:  Allergies  Allergen Reactions   Kiwi Extract Itching   Pineapple Itching   Shellfish Allergy Other (See Comments)    Family is unsure   Latex Itching and Rash    Family History  Problem Relation Age of Onset   Stroke Mother    Stroke Father    Cancer Sister      Past Surgical History  Procedure Laterality Date   Cholecystectomy     Abdominal hysterectomy     Eye surgery      bil. cataracts with lens implants   Thoracentesis  12/03/13   Video bronchoscopy with endobronchial ultrasound N/A 01/07/2014    Procedure: VIDEO BRONCHOSCOPY WITH ENDOBRONCHIAL ULTRASOUND;  Surgeon: Karen Nakayama, Karen Adams;  Location: [place];  Service: Thoracic;  Laterality: N/A;   Thoracentesis Left 01/07/2014    Procedure: THORACENTESIS;  Surgeon: Karen Nakayama, Karen Adams;  Location: [place];  Service: Thoracic;  Laterality:  Left;    History   Social History   Marital Status: Married    Spouse Name: N/A    Number of Children: 3   Years of Education: N/A   Occupational History   Not on file.   Social History Main Topics   Smoking status: Former Smoker -- 0.50 packs/day for 20 years    Types: Cigarettes    Quit date: 01/18/1979   Smokeless tobacco: Never Used   Alcohol Use: No   Drug Use: No   Sexual Activity: Not on file   Other Topics Concern   Not on file   Social History Narrative   No narrative on file     PHYSICAL EXAMINATION:   Filed Vitals:   01/12/14 0430  BP: 137/46  Pulse: 82  Temp: 98 F (36.7 C)  Resp: 20   Filed Weights   01/11/14 1735    Weight: 114 lb 10.2 oz (66 kg)    80 year old wf in no acute distress,frail, ill appearing HEENT: Normocephalic, atraumatic.Sclera anicteric.Oral cavity without thrush or lesions. Neck:  Supple. No thyromegaly,no cervical or supraclavicular adenopathy  Lungs:decreased breath sounds on the left.  No wheezing, rhonchi or rales. Cardiac: Regular rate and rhythm, no murmur,rubs or gallops Abdomen: Soft nontender,bowel sounds x4. No hepatosplenomegaly Extremities: No clubbing cyanosis or edema. No petechial rash Neuro: No focal or motor deficits  LABORATORY/RADIOLOGY DATA:   Recent Labs Lab 01/07/14 1020 01/11/14 1520 01/12/14 0410  WBC 12.5* 12.5* 11.1*  HGB 12.7 12.2 11.7*  HCT 42.5 40.1 38.0  PLT 260 292 297  MCV 94.4 91.8 91.6  MCH 28.2 27.9 28.2  MCHC 29.9* 30.4 30.8  RDW 15.4 15.0 15.0  LYMPHSABS  --  1.0  --   MONOABS  --  1.1*  --   EOSABS  --  0.8*  --   BASOSABS  --  0.0  --     CMP    Recent Labs Lab 01/06/14 1945 01/07/14 1020 01/11/14 1520 01/12/14 0410  NA  --  140 141 140  K  --  4.3 3.8 5.1  CL  --  97 96 96  CO2  --  30 36* 36*  GLUCOSE  --  106* 117* 135*  BUN  --  21 16 16   CREATININE 0.70 0.51 0.52 0.54  CALCIUM  --  9.5 9.2 9.1  AST  --  21  --   --   ALT  --  27  --   --   ALKPHOS  --  173*  --   --   BILITOT  --  0.3  --   --         Component Value Date/Time   BILITOT 0.3 01/07/2014 1020      Recent Labs Lab 01/07/14 1020 01/11/14 1520  INR 0.98 0.98     Liver Function Tests:  Recent Labs Lab 01/07/14 1020  AST 21  ALT 27  ALKPHOS 173*  BILITOT 0.3  PROT 6.6  ALBUMIN 2.6*  Radiology Studies:   01/07/2014   CLINICAL DATA:  Probable metastatic lung carcinoma. Preoperative chest x-ray prior to bronchoscopy and endobronchial ultrasound.  EXAM: CHEST - 2 VIEW  COMPARISON:  Chest x-ray on 12/03/2013 and PET scan on 11/28/2013.  FINDINGS: There is a large recurrent left pleural effusion which may be partially loculated  based on radiographic appearance. Consolidation and atelectasis of the left lower lung is present. A multitude of pulmonary nodules are present bilaterally which all appear slightly  larger in size by chest x-ray. There is a tumor mass along the left suprahilar region abutting the mediastinum. No pneumothorax is identified.  IMPRESSION: Large recurrent left pleural effusion which may be partially loculated based on radiographic appearance. Bilateral pulmonary nodules appear larger in size compared to prior chest x-ray. Left suprahilar mass again identified.   Electronically Signed   By: Karen Edouard M.D.   On: 01/07/2014 10:52   Mr Karen Adams OE Contrast  01/06/2014   COMPARISON:  No comparisons  FINDINGS: There is abnormal marrow signal within the clivus predominantly left of midline. There is no acute infarct. There is no evidence of intracranial hemorrhage, midline shift, or extra-axial fluid collection. Patchy foci of T2 hyperintensity are present in the subcortical and deep cerebral white matter bilaterally, nonspecific but compatible with mild chronic small vessel ischemic disease. There is mild generalized cerebral atrophy. No parenchymal mass or abnormal parenchymal or meningeal enhancement is identified.  Prior bilateral cataract surgery is noted. Mild left maxillary sinus mucosal thickening is present. Mastoid air cells are clear. Major intracranial vascular flow voids are preserved.  IMPRESSION: 1. No evidence of parenchymal brain metastases. 2. Abnormal marrow signal in the clivus, suspicious for osseous metastatic disease. 3. Mild chronic small vessel ischemic disease.   Electronically Signed   By: Karen Adams   On: 01/06/2014 21:06   Dg Chest Port 1 View  01/11/2014   CLINICAL DATA:  Respiratory distress. Lung carcinoma. COPD. Hypertension.  EXAM: PORTABLE CHEST - 1 VIEW  COMPARISON:  01/07/2014  FINDINGS: Increased size of left subpulmonic pleural effusion seen compared to previous study.  Increased atelectasis seen in the left lung base.  Ill-defined pulmonary nodules in the right lower lung are again seen, consistent with pulmonary metastases. Cardiomegaly remains stable. Mediastinal lymphadenopathy in the aortopulmonary window appears stable.  IMPRESSION: Increased left subpulmonic pleural effusion and left basilar atelectasis.  No significant change in mediastinal lymphadenopathy in the AP window and multiple right lung nodules consistent with pulmonary metastases.  Stable cardiomegaly.   Electronically Signed   By: Karen Gell M.D.   On: 01/11/2014 15:24   Dg Chest Port 1 View  01/07/2014   CLINICAL DATA:  s/p bronchoscopy and thoracentesis  EXAM: PORTABLE CHEST - 1 VIEW  COMPARISON:  DG CHEST 2 VIEW dated 01/07/2014  FINDINGS: There is no evidence of a pneumothorax. A mild amount of residual density left lung base. Diffuse thickening of the interstitial markings and diffuse bilateral pulmonary opacities are appreciated. There are areas of peribronchial cuffing. Cardiac silhouette within the upper limits of normal. No acute osseous abnormalities.  IMPRESSION: Residual small left effusion. Atelectasis versus infiltrate left lung base.  Diffuse bilateral pulmonary opacities interstitial pneumonitis versus pulmonary edema  Atelectasis right lung base.   Electronically Signed   By: Karen Mackintosh M.D.   On: 01/07/2014 18:27    Accession: [id number] Received: 01/08/2014 Karen Adams DOB: September 25, 1928 Age: 80 Gender: F Reported: 01/09/2014 [address] Patient Ph: [phone] MRN#: [id number] [place], [place] [address] Client Acc#: Chart: Phone:  Fax: LMP: Visit#: [id number] CC: Karen Bears, Karen Adams CYTOPATHOLOGY REPORT Adequacy Reason Satisfactory For Evaluation. Diagnosis FINE NEEDLE ASPIRATION: EBUS #2, LEVEL 2 (SPECIMEN 2 OF 7, COLLECTED ON 01/07/14): NO MALIGNANT CELLS IDENTIFIED. Preliminary Diagnosis GROUP OF ATYPICAL CELLS NOT DEFINITELY DIAGNOSTIC OF MALIGNANCY (JBK) Karen Cutter Karen Adams Pathologist, Electronic Signature (Case signed 01/09/2014) Specimen Clinical Information Lung mass Source Fine Needle Aspiration, Endoscopic, EBUS #2, Level 2, (Specimen 2 of 7, collected on 01/07/2014)  Accession: [id number] Received: 01/08/2014 Karen Adams DOB: Sep 03, 1929 Age: 61 Gender: F Reported: 01/09/2014 [address] Patient Ph: [phone] MRN#: [id number] [place], [place] [address] Client Acc#: Chart: Phone:  Fax: LMP: Visit#: [id number] CC: Karen Bears, Karen Adams CYTOPATHOLOGY REPORT Adequacy Reason Satisfactory For Evaluation. Diagnosis NO MALIGNANT CELLS IDENTIFIED. 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 The path/cytology  is atypical but not diagnostic for lung cancer, though it is presumed. She may need radiation oncology evaluation to the skull lesion.  Recurrent Left Pleural Effusion For left thoracentesis today by IR. Pleurx Cath to be inserted for comfort, to improve respiratory status.   DVT prophylaxis:  On Lovenox  Full Code  Other medical issues as per admitting team.   **Disclaimer: This note was dictated with voice recognition software. Similar sounding words can inadvertently be transcribed and this note may contain transcription errors which may not have been corrected upon publication of note.Karen Jumbo, Karen Adams 01/12/2014, 11:16 AM  ADDENDUM: Hematology/Oncology Attending: The patient is seen and examined. I agree with the above note. She is a very pleasant 80 years old white female with questionable metastatic lung cancer but unfortunately no tissue diagnosis yet. The patient underwent several procedures including ultrasound guided thoracentesis and the final cytology showed atypical  cells. She also had bronchoscopy twice with endobronchial ultrasound and biopsies but the final cytology showed no malignant cells. She recently completed palliative radiotherapy to the T1-T3 metastatic bone lesion under the care of Karen Adams. Her recent MRI of the brain showed no evidence for metastatic disease to the brain but the previous PET scan showed hypermetabolic left suprahilar mass consistent with primary bronchogenic carcinoma in addition to bilateral hypermetabolic small pulmonary nodules and hypermetabolic mediastinal nodal metastasis in addition to a single metastasis to the body of the pancreas and widespread skeletal metastasis. All these findings are highly suspicious for metastatic lung but unfortunately no tissue diagnosis of malignancy is confirmed. The patient was admitted with shortness of breath and worsening left pleural effusion. She underwent ultrasound-guided thoracentesis with placement of Pleurx catheter by interventional radiology.  She is feeling a little bit better today. I have a lengthy discussion with the patient and her daughter and granddaughter who were at the bedside today. I recommended for them to proceed with CT-guided biopsy of the hypermetabolic lesion in the posterior right iliac bone for confirmation of the tissue diagnosis. They are in agreement with the current. Once a tissue diagnosis is confirmed, I will discuss with the patient and her family her treatment options regarding systemic therapy. Continue current care. Thank you so much for taking good care of Karen. Owens Shark, I will continue to follow up the patient with you and assist in her management on as-needed basis.

## 2014-01-12 NOTE — Procedures (Signed)
L PleuRx drain 200 cc yellow fluid aspirated No comp

## 2014-01-12 NOTE — Progress Notes (Signed)
[facility] PROGRESS NOTE  Karen Adams NID:[id number] DOB: 1929/04/26 DOA: 01/11/2014 PCP: Truitt Merle, NP  Assessment/Plan: Acute Hypoxemic Respiratory Failure -Related to lung mass and left sided pleural effusion. -S/p thoracentesis and placement of pleurex catheter. -Discussed with Dr. Leonarda Salon RN and they will take over care of her pleurex catheter. -Continue oxygen support and nebs PRN.  Malignant Recurrent Left Pleural Effusion -As above, is s/p placement of a pleurex catheter.  Presumed Stage IV Lung Cancer -Bronchoscopy was non-diagnostic. -Dr. Earlie Server to see for further recommendations, but doubt she would be a candidate for systemic therapy given her poor functional status at present. -Briefly touched on the subject of hospice care with patient's daughter, but will await formal recommendations from Dr. Julien Nordmann before proceeding.  H/o A Fib -Rate controlled on metoprolol. -Not anticoagulated at present.  Code Status: Full Code for now as discussed with daughter Family Communication: Daughter and granddaughter at bedside updated on status and plan of care.  Disposition Plan: To be determined, likely home.   Consultants:  IR  Oncology   Antibiotics:  None   Subjective: Still says "I can't breathe". No other complaints.  Objective: Filed Vitals:   01/12/14 1310 01/12/14 1316 01/12/14 1338 01/12/14 1500  BP: 126/65 126/67 125/54 123/50  Pulse: 76 77 79 77  Temp:   98.6 F (37 C) 98.3 F (36.8 C)  TempSrc:   Oral Oral  Resp: 30 28 20 18   Height:      Weight:      SpO2: 93% 93% 100% 98%    Intake/Output Summary (Last 24 hours) at 01/12/14 1632 Last data filed at 01/12/14 1500  Gross per 24 hour  Intake    170 ml  Output    125 ml  Net     45 ml   Filed Weights   01/11/14 1735  Weight: 52 kg (114 lb 10.2 oz)    Exam:   General:  Awake, seems confused at times  Cardiovascular: Irregular, reglar rate  Respiratory:  Decreased BS left base  Abdomen: S/NT/ND/+BS  Extremities: 1+ edema Bilaterally   Neurologic:  Non-focal  Data Reviewed: Basic Metabolic Panel:  Recent Labs Lab 01/06/14 1945 01/07/14 1020 01/11/14 1520 01/12/14 0410  NA  --  140 141 140  K  --  4.3 3.8 5.1  CL  --  97 96 96  CO2  --  30 36* 36*  GLUCOSE  --  106* 117* 135*  BUN  --  21 16 16   CREATININE 0.70 0.51 0.52 0.54  CALCIUM  --  9.5 9.2 9.1   Liver Function Tests:  Recent Labs Lab 01/07/14 1020  AST 21  ALT 27  ALKPHOS 173*  BILITOT 0.3  PROT 6.6  ALBUMIN 2.6*   No results found for this basename: LIPASE, AMYLASE,  in the last 168 hours No results found for this basename: AMMONIA,  in the last 168 hours CBC:  Recent Labs Lab 01/07/14 1020 01/11/14 1520 01/12/14 0410  WBC 12.5* 12.5* 11.1*  NEUTROABS  --  9.6*  --   HGB 12.7 12.2 11.7*  HCT 42.5 40.1 38.0  MCV 94.4 91.8 91.6  PLT 260 292 297   Cardiac Enzymes: No results found for this basename: CKTOTAL, CKMB, CKMBINDEX, TROPONINI,  in the last 168 hours BNP (last 3 results) No results found for this basename: PROBNP,  in the last 8760 hours CBG: No results found for this basename: GLUCAP,  in the last 168 hours  No  results found for this or any previous visit (from the past 240 hour(s)).   Studies: Ir Lenise Arena W Catheter Placement  01/12/2014   CLINICAL DATA:  Lung cancer.  Left pleural effusion.  EXAM: ABSCESS DRAINAGE; IR ULTRASOUND GUIDANCE  FLUOROSCOPY TIME:  42 seconds.  MEDICATIONS AND MEDICAL HISTORY: Versed One mg, Fentanyl 20 mcg.  As antibiotic prophylaxis, Ancef was ordered pre-procedure and administered intravenously within one hour of incision.  ANESTHESIA/SEDATION: Moderate sedation time: 13 minutes  CONTRAST:  None  PROCEDURE: The procedure, risks, benefits, and alternatives were explained to the patient. Questions regarding the procedure were encouraged and answered. The patient understands and consents to the procedure.   The left posterior and lateral thorax was prepped with Betadine in a sterile fashion, and a sterile drape was applied covering the operative field. A sterile gown and sterile gloves were used for the procedure.  Under sonographic guidance, an Angiocath was inserted into the pleural space and removed over the 3 J. The tract was dilated up to the peel-away sheath. A second incision was made 10 cm anterior to the puncture site. The leading edge of the drainage catheter was advanced from the second incision out the puncture site incision. It was fed through the peel-away sheath. The peel-away sheath was removed. The cuff of the catheter was positioned in the subcutaneous tract adjacent to the second incision. 200 cc clear yellow fluid was aspirated. The puncture incision was closed with a 3-0 Vicryl subcutaneous stitch and a 4-0 Vicryl subcuticular stitch. The second incision was closed with an 0 Prolene pursestring.  FINDINGS: Sonographic and fluoroscopic images demonstrate placement of a left PleurX drain.  COMPLICATIONS: None  IMPRESSION: Successful left PleurX drain placement.   Electronically Signed   By: Maryclare Bean M.D.   On: 01/12/2014 14:56   Ir US Guide Bx Asp/drain  01/12/2014   CLINICAL DATA:  Lung cancer.  Left pleural effusion.  EXAM: ABSCESS DRAINAGE; IR ULTRASOUND GUIDANCE  FLUOROSCOPY TIME:  42 seconds.  MEDICATIONS AND MEDICAL HISTORY: Versed One mg, Fentanyl 20 mcg.  As antibiotic prophylaxis, Ancef was ordered pre-procedure and administered intravenously within one hour of incision.  ANESTHESIA/SEDATION: Moderate sedation time: 13 minutes  CONTRAST:  None  PROCEDURE: The procedure, risks, benefits, and alternatives were explained to the patient. Questions regarding the procedure were encouraged and answered. The patient understands and consents to the procedure.  The left posterior and lateral thorax was prepped with Betadine in a sterile fashion, and a sterile drape was applied covering the  operative field. A sterile gown and sterile gloves were used for the procedure.  Under sonographic guidance, an Angiocath was inserted into the pleural space and removed over the 3 J. The tract was dilated up to the peel-away sheath. A second incision was made 10 cm anterior to the puncture site. The leading edge of the drainage catheter was advanced from the second incision out the puncture site incision. It was fed through the peel-away sheath. The peel-away sheath was removed. The cuff of the catheter was positioned in the subcutaneous tract adjacent to the second incision. 200 cc clear yellow fluid was aspirated. The puncture incision was closed with a 3-0 Vicryl subcutaneous stitch and a 4-0 Vicryl subcuticular stitch. The second incision was closed with an 0 Prolene pursestring.  FINDINGS: Sonographic and fluoroscopic images demonstrate placement of a left PleurX drain.  COMPLICATIONS: None  IMPRESSION: Successful left PleurX drain placement.   Electronically Signed   By: Duffy Rhody.D.  On: 01/12/2014 14:56   Dg Chest Port 1 View  01/11/2014   CLINICAL DATA:  Respiratory distress. Lung carcinoma. COPD. Hypertension.  EXAM: PORTABLE CHEST - 1 VIEW  COMPARISON:  01/07/2014  FINDINGS: Increased size of left subpulmonic pleural effusion seen compared to previous study. Increased atelectasis seen in the left lung base.  Ill-defined pulmonary nodules in the right lower lung are again seen, consistent with pulmonary metastases. Cardiomegaly remains stable. Mediastinal lymphadenopathy in the aortopulmonary window appears stable.  IMPRESSION: Increased left subpulmonic pleural effusion and left basilar atelectasis.  No significant change in mediastinal lymphadenopathy in the AP window and multiple right lung nodules consistent with pulmonary metastases.  Stable cardiomegaly.   Electronically Signed   By: Earle Gell M.D.   On: 01/11/2014 15:24    Scheduled Meds: . antiseptic oral rinse  15 mL Mouth Rinse BID    . atorvastatin  10 mg Oral Daily  . dexamethasone  4 mg Oral Daily  . donepezil  10 mg Oral QHS  . heparin  5,000 Units Subcutaneous 3 times per day  . lidocaine      . metoprolol tartrate  12.5 mg Oral BID  . NIFEdipine  30 mg Oral Daily  . OxyCODONE  10 mg Oral Q12H  . pantoprazole  40 mg Oral Daily  . sertraline  100 mg Oral Daily  . sodium chloride  3 mL Intravenous Q12H  . sodium chloride  3 mL Intravenous Q12H   Continuous Infusions:   Principal Problem:   Acute respiratory failure Active Problems:   Pleural effusion   Lung cancer   A-fib    Time spent: 35 minutes. Greater than 50% of this time was spent in direct contact with the patient coordinating care.    [facility] Pager [phone]  If 7PM-7AM, please contact night-coverage at [email or web address], password [facility] 01/12/2014, 4:32 PM  LOS: 1 day

## 2014-01-12 NOTE — Care Management Note (Addendum)
    Page 1 of 2   01/14/2014     12:43:50 PM CARE MANAGEMENT NOTE 01/14/2014  Patient:  Karen Adams, Karen Adams   Account Number:  [id number]  Date Initiated:  01/12/2014  Documentation initiated by:  Dessa Phi  Subjective/Objective Assessment:   80 Y/O F ADMITTED W/ACURE RESP FAILURE.     Action/Plan:   FROM HOME.HAS PCP,PHARMACY,W/C.   Anticipated DC Date:  01/14/2014   Anticipated DC Plan:  [place]  CM consult      [place] [facility]  HOME HEALTH   Choice offered to / List presented to:  C-4 Adult Children           Status of service:  In process, will continue to follow Medicare Important Message given?  YES (If response is "NO", the following Medicare IM given date fields will be blank) Date Medicare IM given:  01/14/2014 Date Additional Medicare IM given:    Discharge Disposition:    Per UR Regulation:  Reviewed for med. necessity/level of care/duration of stay  If discussed at [place] of Stay Meetings, dates discussed:    Comments:  01/14/14 Kaiel Weide RN,BSN NCM [address] [place] CATH DRAIN AFTER THE INSTRUCTION FROM [place].PT TO EVAL-AWAIT RECOMMENDATIONS.[facility] ALREADY FOLLOWING FOR HHC-[facility] REP AWARE OF D/C TODAY.NURSE WILL INSURE ADDITIONAL PLEURX CANNISTERS ARE IN RM FOR PATIENT TO D/C HOME WITH.[facility]CTS-DR. HENDRICKSON'S OFFICE-JOLEN(NURSE)-TEL#[phone]/FAX#[phone]-MANAGING [employer] FORM COMPLETION & SENDING TO [employer] FOR ADDITIONAL SUPPLIES TO BE DELIVERED TO PATIENT'S HOME.IF HHPT NEEDED WILL NEED HHPT ORDERED.RECEIVED CALL FROM NURSE FOR HOME 02 TRAVEL TANK.[facility] REP [place] AWARE OF TRAVEL TANK NEEDED PRIOR D/C TODAY.  01/13/14 Rafal Archuleta RN,BSN NCM [phone] FAXED PLEURX ORDER FORM TO [facility]/DR. HENDRICKSON-WILL MANAGE PLEURX CATH DRAIN-FAXED TO OFFICE TEL#[phone]/FAX#[phone] W/CONFIRMATION TO JOLEN(NURSE) FOR COMPLETION.WILL AWAIT RETURN FAX OF FORM THEN TO FAX TO  [place].  01/13/14 12:45 CM spoke with daughter of pt (pt  is sleeping), Karen Adams [phone].  Karen Adams is a Therapist, sports who works for [employer].  Karen Adams states she takes care of both her mother, the patient, and her father and there will be someone at home eith the patient at all times.  Pleurex cath kit requisition papers on shadow chart for MD to address; CM messaged MD to please fill out and place [facility] order.  Karen Adams states she may not need HHRN if dressing supplies are made available and teaching her to change dressing and management of the Pleurex tubes is an option. RN aware.  Referral to [facility] made as a tentative referral to give time for daughter to assess what management of the pleurex cath entails.  CM will continue to monitor for disposition.  Karen Adams, BSN, Jearld Lesch [phone].  01/12/14 Dessa Phi RN, BSN NCM [phone] S/P THORACENTESIS.FOR PLEURX CATH.IF HHC NEEDED FOR HOME PLEURX CATH F/U,CAN ARRANGE W/HHRN ORDER.

## 2014-01-13 ENCOUNTER — Ambulatory Visit (HOSPITAL_COMMUNITY): Payer: Medicare Other

## 2014-01-13 ENCOUNTER — Encounter (HOSPITAL_COMMUNITY): Payer: Self-pay | Admitting: Radiology

## 2014-01-13 ENCOUNTER — Ambulatory Visit: Payer: Medicare Other | Admitting: Thoracic Surgery (Cardiothoracic Vascular Surgery)

## 2014-01-13 MED ORDER — ALBUTEROL SULFATE (2.5 MG/3ML) 0.083% IN NEBU
2.5000 mg | INHALATION_SOLUTION | Freq: Four times a day (QID) | RESPIRATORY_TRACT | Status: DC
  Administered 2014-01-13 – 2014-01-14 (×5): 2.5 mg via RESPIRATORY_TRACT
  Filled 2014-01-13 (×5): qty 3

## 2014-01-13 MED ORDER — FENTANYL CITRATE 0.05 MG/ML IJ SOLN
INTRAMUSCULAR | Status: AC | PRN
  Administered 2014-01-13: 50 ug via INTRAVENOUS

## 2014-01-13 MED ORDER — FENTANYL CITRATE 0.05 MG/ML IJ SOLN
INTRAMUSCULAR | Status: AC
  Filled 2014-01-13: qty 2

## 2014-01-13 MED ORDER — MIDAZOLAM HCL 2 MG/2ML IJ SOLN
INTRAMUSCULAR | Status: AC
  Filled 2014-01-13: qty 2

## 2014-01-13 NOTE — H&P (Signed)
Karen Adams is an 80 y.o. female.    Chief Complaint: Presumed Lung Ca Left PleurX cath placed 5/11 for recurrent effusion Brochoscopy and EBUS and thoracentesis all non diagnostic for cancer diagnosis Many bony lesions on PET Dr Julien Nordmann has requested consult for Rt iliac lesion biopsy for tissue diagnosis Dr Barbie Banner has reviewed imaging and feels we can move forward with biopsy  HPI: Afib; COPD; CVA; prob lung ca; mac degeneration  Past Medical History  Diagnosis Date  . COPD (chronic obstructive pulmonary disease)   . A-fib   . Stroke     memory loss  . Dysrhythmia     A-Fibrillation  . Shortness of breath   . Arthritis   . Nausea   . Cancer     lung,pancreas, bone-new diagnosis  . Macular degeneration   . Hypertension     no longer being treated for this  . Aortic aneurysm     Followed by Dr. Servando Snare, very small (per daughter)  . Asthma   . On home oxygen therapy     2 L all the time  . Depression   . Complication of anesthesia   . PONV (postoperative nausea and vomiting)     "a little"  . History of shingles     Past Surgical History  Procedure Laterality Date  . Cholecystectomy    . Abdominal hysterectomy    . Eye surgery      bil. cataracts with lens implants  . Thoracentesis  12/03/13  . Video bronchoscopy with endobronchial ultrasound N/A 01/07/2014    Procedure: VIDEO BRONCHOSCOPY WITH ENDOBRONCHIAL ULTRASOUND;  Surgeon: Melrose Nakayama, MD;  Location: [place];  Service: Thoracic;  Laterality: N/A;  . Thoracentesis Left 01/07/2014    Procedure: THORACENTESIS;  Surgeon: Melrose Nakayama, MD;  Location: [facility] OR;  Service: Thoracic;  Laterality: Left;    Family History  Problem Relation Age of Onset  . Stroke Mother   . Stroke Father   . Cancer Sister    Social History:  reports that she quit smoking about 35 years ago. Her smoking use included Cigarettes. She has a 10 pack-year smoking history. She has never used smokeless tobacco. She reports that she  does not drink alcohol or use illicit drugs.  Allergies:  Allergies  Allergen Reactions  . Kiwi Extract Itching  . Pineapple Itching  . Shellfish Allergy Other (See Comments)    Family is unsure  . Latex Itching and Rash    Medications Prior to Admission  Medication Sig Dispense Refill  . albuterol (PROVENTIL) (2.5 MG/3ML) 0.083% nebulizer solution Take 2.5 mg by nebulization every 3 (three) hours as needed for wheezing or shortness of breath.      Marland Kitchen atorvastatin (LIPITOR) 10 MG tablet Take 1 tablet (10 mg total) by mouth daily.  30 tablet  0  . dexamethasone (DECADRON) 4 MG tablet Take 4 mg by mouth daily.       Marland Kitchen donepezil (ARICEPT) 10 MG tablet Take 1 tablet (10 mg total) by mouth at bedtime.  30 tablet  3  . folic acid (FOLVITE) 1 MG tablet Take 1 tablet (1 mg total) by mouth daily.  30 tablet  3  . gabapentin (NEURONTIN) 600 MG tablet Take 1,200 mg by mouth at bedtime.      . hyaluronate sodium (RADIAPLEXRX) GEL Apply 1 application topically 2 (two) times daily as needed. Apply to affected area after radiation treatments      . lidocaine (LIDODERM) 5 % Place  1 patch onto the skin daily as needed (Pain. Applied to back). Remove & Discard patch within 12 hours or as directed by MD      . LORazepam (ATIVAN) 0.5 MG tablet Take 0.5 mg by mouth every 8 (eight) hours as needed for anxiety or sleep.       . metoprolol tartrate (LOPRESSOR) 25 MG tablet Take 0.5 tablets (12.5 mg total) by mouth 2 (two) times daily.  60 tablet  3  . NIFEdipine (PROCARDIA-XL/ADALAT CC) 30 MG 24 hr tablet Take 1 tablet (30 mg total) by mouth daily.  30 tablet  3  . ondansetron (ZOFRAN) 4 MG tablet Take 1 tablet (4 mg total) by mouth every 8 (eight) hours as needed for nausea or vomiting.  30 tablet  0  . oxycodone (OXY-IR) 5 MG capsule Take 10 mg by mouth every 4 (four) hours as needed for pain.       . OxyCODONE (OXYCONTIN) 10 mg T12A 12 hr tablet Take 10 mg by mouth every 12 (twelve) hours.      . pantoprazole  (PROTONIX) 40 MG tablet Take 40 mg by mouth daily.       . sertraline (ZOLOFT) 100 MG tablet Take 1 tablet (100 mg total) by mouth daily.  30 tablet  0    Results for orders placed during the hospital encounter of 01/11/14 (from the past 48 hour(s))  CBC WITH DIFFERENTIAL     Status: Abnormal   Collection Time    01/11/14  3:20 PM      Result Value Ref Range   WBC 12.5 (*) 4.0 - 10.5 K/uL   RBC 4.37  3.87 - 5.11 MIL/uL   Hemoglobin 12.2  12.0 - 15.0 g/dL   HCT 40.1  36.0 - 46.0 %   MCV 91.8  78.0 - 100.0 fL   MCH 27.9  26.0 - 34.0 pg   MCHC 30.4  30.0 - 36.0 g/dL   RDW 15.0  11.5 - 15.5 %   Platelets 292  150 - 400 K/uL   Neutrophils Relative % 77  43 - 77 %   Neutro Abs 9.6 (*) 1.7 - 7.7 K/uL   Lymphocytes Relative 8 (*) 12 - 46 %   Lymphs Abs 1.0  0.7 - 4.0 K/uL   Monocytes Relative 9  3 - 12 %   Monocytes Absolute 1.1 (*) 0.1 - 1.0 K/uL   Eosinophils Relative 7 (*) 0 - 5 %   Eosinophils Absolute 0.8 (*) 0.0 - 0.7 K/uL   Basophils Relative 0  0 - 1 %   Basophils Absolute 0.0  0.0 - 0.1 K/uL  BASIC METABOLIC PANEL     Status: Abnormal   Collection Time    01/11/14  3:20 PM      Result Value Ref Range   Sodium 141  137 - 147 mEq/L   Potassium 3.8  3.7 - 5.3 mEq/L   Chloride 96  96 - 112 mEq/L   CO2 36 (*) 19 - 32 mEq/L   Glucose, Bld 117 (*) 70 - 99 mg/dL   BUN 16  6 - 23 mg/dL   Creatinine, Ser 0.52  0.50 - 1.10 mg/dL   Calcium 9.2  8.4 - 10.5 mg/dL   GFR calc non Af Amer 87 (*) >90 mL/min   GFR calc Af Amer >90  >90 mL/min   Comment: (NOTE)     The eGFR has been calculated using the CKD EPI equation.     This  calculation has not been validated in all clinical situations.     eGFR's persistently <90 mL/min signify possible Chronic Kidney     Disease.  PROTIME-INR     Status: None   Collection Time    01/11/14  3:20 PM      Result Value Ref Range   Prothrombin Time 12.8  11.6 - 15.2 seconds   INR 0.98  0.00 - 1.49  I-STAT TROPOININ, ED     Status: None    Collection Time    01/11/14  3:34 PM      Result Value Ref Range   Troponin i, poc 0.00  0.00 - 0.08 ng/mL   Comment 3            Comment: Due to the release kinetics of cTnI,     a negative result within the first hours     of the onset of symptoms does not rule out     myocardial infarction with certainty.     If myocardial infarction is still suspected,     repeat the test at appropriate intervals.  I-STAT CG4 LACTIC ACID, ED     Status: None   Collection Time    01/11/14  3:36 PM      Result Value Ref Range   Lactic Acid, Venous 1.67  0.5 - 2.2 mmol/L  BASIC METABOLIC PANEL     Status: Abnormal   Collection Time    01/12/14  4:10 AM      Result Value Ref Range   Sodium 140  137 - 147 mEq/L   Potassium 5.1  3.7 - 5.3 mEq/L   Comment: NO VISIBLE HEMOLYSIS     DELTA CHECK NOTED   Chloride 96  96 - 112 mEq/L   CO2 36 (*) 19 - 32 mEq/L   Glucose, Bld 135 (*) 70 - 99 mg/dL   BUN 16  6 - 23 mg/dL   Creatinine, Ser 0.54  0.50 - 1.10 mg/dL   Calcium 9.1  8.4 - 10.5 mg/dL   GFR calc non Af Amer 86 (*) >90 mL/min   GFR calc Af Amer >90  >90 mL/min   Comment: (NOTE)     The eGFR has been calculated using the CKD EPI equation.     This calculation has not been validated in all clinical situations.     eGFR's persistently <90 mL/min signify possible Chronic Kidney     Disease.  CBC     Status: Abnormal   Collection Time    01/12/14  4:10 AM      Result Value Ref Range   WBC 11.1 (*) 4.0 - 10.5 K/uL   RBC 4.15  3.87 - 5.11 MIL/uL   Hemoglobin 11.7 (*) 12.0 - 15.0 g/dL   HCT 38.0  36.0 - 46.0 %   MCV 91.6  78.0 - 100.0 fL   MCH 28.2  26.0 - 34.0 pg   MCHC 30.8  30.0 - 36.0 g/dL   RDW 15.0  11.5 - 15.5 %   Platelets 297  150 - 400 K/uL   Ir Guided Drain W Catheter Placement  01/12/2014   CLINICAL DATA:  Lung cancer.  Left pleural effusion.  EXAM: ABSCESS DRAINAGE; IR ULTRASOUND GUIDANCE  FLUOROSCOPY TIME:  42 seconds.  MEDICATIONS AND MEDICAL HISTORY: Versed One mg, Fentanyl 20  mcg.  As antibiotic prophylaxis, Ancef was ordered pre-procedure and administered intravenously within one hour of incision.  ANESTHESIA/SEDATION: Moderate sedation time: 13 minutes  CONTRAST:  None  PROCEDURE: The procedure, risks, benefits, and alternatives were explained to the patient. Questions regarding the procedure were encouraged and answered. The patient understands and consents to the procedure.  The left posterior and lateral thorax was prepped with Betadine in a sterile fashion, and a sterile drape was applied covering the operative field. A sterile gown and sterile gloves were used for the procedure.  Under sonographic guidance, an Angiocath was inserted into the pleural space and removed over the 3 J. The tract was dilated up to the peel-away sheath. A second incision was made 10 cm anterior to the puncture site. The leading edge of the drainage catheter was advanced from the second incision out the puncture site incision. It was fed through the peel-away sheath. The peel-away sheath was removed. The cuff of the catheter was positioned in the subcutaneous tract adjacent to the second incision. 200 cc clear yellow fluid was aspirated. The puncture incision was closed with a 3-0 Vicryl subcutaneous stitch and a 4-0 Vicryl subcuticular stitch. The second incision was closed with an 0 Prolene pursestring.  FINDINGS: Sonographic and fluoroscopic images demonstrate placement of a left PleurX drain.  COMPLICATIONS: None  IMPRESSION: Successful left PleurX drain placement.   Electronically Signed   By: Maryclare Bean M.D.   On: 01/12/2014 14:56   Ir US Guide Bx Asp/drain  01/12/2014   CLINICAL DATA:  Lung cancer.  Left pleural effusion.  EXAM: ABSCESS DRAINAGE; IR ULTRASOUND GUIDANCE  FLUOROSCOPY TIME:  42 seconds.  MEDICATIONS AND MEDICAL HISTORY: Versed One mg, Fentanyl 20 mcg.  As antibiotic prophylaxis, Ancef was ordered pre-procedure and administered intravenously within one hour of incision.   ANESTHESIA/SEDATION: Moderate sedation time: 13 minutes  CONTRAST:  None  PROCEDURE: The procedure, risks, benefits, and alternatives were explained to the patient. Questions regarding the procedure were encouraged and answered. The patient understands and consents to the procedure.  The left posterior and lateral thorax was prepped with Betadine in a sterile fashion, and a sterile drape was applied covering the operative field. A sterile gown and sterile gloves were used for the procedure.  Under sonographic guidance, an Angiocath was inserted into the pleural space and removed over the 3 J. The tract was dilated up to the peel-away sheath. A second incision was made 10 cm anterior to the puncture site. The leading edge of the drainage catheter was advanced from the second incision out the puncture site incision. It was fed through the peel-away sheath. The peel-away sheath was removed. The cuff of the catheter was positioned in the subcutaneous tract adjacent to the second incision. 200 cc clear yellow fluid was aspirated. The puncture incision was closed with a 3-0 Vicryl subcutaneous stitch and a 4-0 Vicryl subcuticular stitch. The second incision was closed with an 0 Prolene pursestring.  FINDINGS: Sonographic and fluoroscopic images demonstrate placement of a left PleurX drain.  COMPLICATIONS: None  IMPRESSION: Successful left PleurX drain placement.   Electronically Signed   By: Maryclare Bean M.D.   On: 01/12/2014 14:56   Dg Chest Port 1 View  01/11/2014   CLINICAL DATA:  Respiratory distress. Lung carcinoma. COPD. Hypertension.  EXAM: PORTABLE CHEST - 1 VIEW  COMPARISON:  01/07/2014  FINDINGS: Increased size of left subpulmonic pleural effusion seen compared to previous study. Increased atelectasis seen in the left lung base.  Ill-defined pulmonary nodules in the right lower lung are again seen, consistent with pulmonary metastases. Cardiomegaly remains stable. Mediastinal lymphadenopathy in the  aortopulmonary window appears stable.  IMPRESSION: Increased left subpulmonic pleural effusion and left basilar atelectasis.  No significant change in mediastinal lymphadenopathy in the AP window and multiple right lung nodules consistent with pulmonary metastases.  Stable cardiomegaly.   Electronically Signed   By: Earle Gell M.D.   On: 01/11/2014 15:24    Review of Systems  Constitutional: Positive for weight loss. Negative for fever.  Respiratory: Positive for cough. Negative for shortness of breath.   Cardiovascular: Positive for chest pain.       L chest wall pain secondary L PleurX cath placed 5/11  Gastrointestinal: Negative for nausea, vomiting and abdominal pain.  Neurological: Positive for weakness.  Psychiatric/Behavioral: Positive for memory loss. Negative for substance abuse.       Mildly confused oday    Blood pressure 141/56, pulse 63, temperature 98.4 F (36.9 C), temperature source Oral, resp. rate 16, height 5' (1.524 m), weight 52 kg (114 lb 10.2 oz), SpO2 100.00%. Physical Exam  Constitutional:  Thin, frail  Cardiovascular: Normal rate and regular rhythm.   No murmur heard. Respiratory: Effort normal. She has wheezes.  Insp wheeze on L Ecchymotic area posterior to site of PleurX  GI: Soft. Bowel sounds are normal. There is no tenderness.  Musculoskeletal: Normal range of motion.  Neurological:  Somewhat confused  Skin: Skin is warm and dry.  Psychiatric:  Dtr signed consent in room     Assessment/Plan Presumed Lung CA Non diagnostic Bronchoscopy; EBUS and thoracentesis Request made from Dr Julien Nordmann for Rt iliac lesion bx for tissue dx Pt now scheduled for same Pts family aware of procedure benefits and risks and agreeable to proceed Consent signed and in chart- dtr signed consent  Lavonia Drafts 01/13/2014, 9:44 AM

## 2014-01-13 NOTE — Progress Notes (Signed)
Patient admitted over the weekend with acute on chronic respiratory failure  She had partial reaccumulation of her left pleural effusion  Her biopsies from the bronchoscopy/ENB were all non-diagnostic  She had a bone biopsy today. Results are pending  IR placed a pleur-X catheter over the weekend, but are refusing to follow it. I told the family I will follow her for that.  She is sleeping currently- I did not wake her.  My office will call them and set up an appointment for next week.  I would send her home on every other day drainages of the pleur-x and we can adjust as we go along

## 2014-01-13 NOTE — Progress Notes (Signed)
[facility] PROGRESS NOTE  Karen Adams SLH:[id number] DOB: 1928/10/14 DOA: 01/11/2014 PCP: Truitt Merle, NP  Assessment/Plan: Acute Hypoxemic Respiratory Failure -Related to lung mass and left sided pleural effusion. -S/p thoracentesis and placement of pleurex catheter. -Discussed with Dr. Leonarda Salon RN and they will take over care of her pleurex catheter. -Continue oxygen support and nebs PRN.  Malignant Recurrent Left Pleural Effusion -As above, is s/p placement of a pleurex catheter.  Presumed Stage IV Lung Cancer -Bronchoscopy was non-diagnostic. -For iliac bone biopsy today in hopes of a tissue diagnosis. -Dr. Earlie Server to see for further recommendations, but doubt she would be a candidate for systemic therapy given her poor functional status at present. -Briefly touched on the subject of hospice care with patient's daughter, but will await formal recommendations from Dr. Julien Nordmann before proceeding.  H/o A Fib -Rate controlled on metoprolol. -Not anticoagulated at present.  Code Status: Full Code for now as discussed with daughter Family Communication: Daughter and granddaughter at bedside updated on status and plan of care.  Disposition Plan: To be determined, likely home.   Consultants:  IR  Oncology   Antibiotics:  None   Subjective: Still says "I can't breathe". No other complaints. Scared about biopsy scheduled for today.  Objective: Filed Vitals:   01/13/14 1419 01/13/14 1429 01/13/14 1431 01/13/14 1433  BP: 160/87 144/55 146/49 152/59  Pulse: 101 89 92 91  Temp:      TempSrc:      Resp: 25 22 19 21   Height:      Weight:      SpO2: 86% 88% 88% 89%    Intake/Output Summary (Last 24 hours) at 01/13/14 1505 Last data filed at 01/13/14 1043  Gross per 24 hour  Intake      6 ml  Output      0 ml  Net      6 ml   Filed Weights   01/11/14 1735  Weight: 52 kg (114 lb 10.2 oz)    Exam:   General:  Awake, seems confused at  times  Cardiovascular: Irregular, reglar rate  Respiratory: Decreased BS left base  Abdomen: S/NT/ND/+BS  Extremities: 1+ edema Bilaterally   Neurologic:  Non-focal  Data Reviewed: Basic Metabolic Panel:  Recent Labs Lab 01/06/14 1945 01/07/14 1020 01/11/14 1520 01/12/14 0410  NA  --  140 141 140  K  --  4.3 3.8 5.1  CL  --  97 96 96  CO2  --  30 36* 36*  GLUCOSE  --  106* 117* 135*  BUN  --  21 16 16   CREATININE 0.70 0.51 0.52 0.54  CALCIUM  --  9.5 9.2 9.1   Liver Function Tests:  Recent Labs Lab 01/07/14 1020  AST 21  ALT 27  ALKPHOS 173*  BILITOT 0.3  PROT 6.6  ALBUMIN 2.6*   No results found for this basename: LIPASE, AMYLASE,  in the last 168 hours No results found for this basename: AMMONIA,  in the last 168 hours CBC:  Recent Labs Lab 01/07/14 1020 01/11/14 1520 01/12/14 0410  WBC 12.5* 12.5* 11.1*  NEUTROABS  --  9.6*  --   HGB 12.7 12.2 11.7*  HCT 42.5 40.1 38.0  MCV 94.4 91.8 91.6  PLT 260 292 297   Cardiac Enzymes: No results found for this basename: CKTOTAL, CKMB, CKMBINDEX, TROPONINI,  in the last 168 hours BNP (last 3 results) No results found for this basename: PROBNP,  in the last 8760 hours  CBG: No results found for this basename: GLUCAP,  in the last 168 hours  No results found for this or any previous visit (from the past 240 hour(s)).   Studies: Ir Lenise Arena W Catheter Placement  01/12/2014   CLINICAL DATA:  Lung cancer.  Left pleural effusion.  EXAM: ABSCESS DRAINAGE; IR ULTRASOUND GUIDANCE  FLUOROSCOPY TIME:  42 seconds.  MEDICATIONS AND MEDICAL HISTORY: Versed One mg, Fentanyl 20 mcg.  As antibiotic prophylaxis, Ancef was ordered pre-procedure and administered intravenously within one hour of incision.  ANESTHESIA/SEDATION: Moderate sedation time: 13 minutes  CONTRAST:  None  PROCEDURE: The procedure, risks, benefits, and alternatives were explained to the patient. Questions regarding the procedure were encouraged and  answered. The patient understands and consents to the procedure.  The left posterior and lateral thorax was prepped with Betadine in a sterile fashion, and a sterile drape was applied covering the operative field. A sterile gown and sterile gloves were used for the procedure.  Under sonographic guidance, an Angiocath was inserted into the pleural space and removed over the 3 J. The tract was dilated up to the peel-away sheath. A second incision was made 10 cm anterior to the puncture site. The leading edge of the drainage catheter was advanced from the second incision out the puncture site incision. It was fed through the peel-away sheath. The peel-away sheath was removed. The cuff of the catheter was positioned in the subcutaneous tract adjacent to the second incision. 200 cc clear yellow fluid was aspirated. The puncture incision was closed with a 3-0 Vicryl subcutaneous stitch and a 4-0 Vicryl subcuticular stitch. The second incision was closed with an 0 Prolene pursestring.  FINDINGS: Sonographic and fluoroscopic images demonstrate placement of a left PleurX drain.  COMPLICATIONS: None  IMPRESSION: Successful left PleurX drain placement.   Electronically Signed   By: Maryclare Bean M.D.   On: 01/12/2014 14:56   Ir US Guide Bx Asp/drain  01/12/2014   CLINICAL DATA:  Lung cancer.  Left pleural effusion.  EXAM: ABSCESS DRAINAGE; IR ULTRASOUND GUIDANCE  FLUOROSCOPY TIME:  42 seconds.  MEDICATIONS AND MEDICAL HISTORY: Versed One mg, Fentanyl 20 mcg.  As antibiotic prophylaxis, Ancef was ordered pre-procedure and administered intravenously within one hour of incision.  ANESTHESIA/SEDATION: Moderate sedation time: 13 minutes  CONTRAST:  None  PROCEDURE: The procedure, risks, benefits, and alternatives were explained to the patient. Questions regarding the procedure were encouraged and answered. The patient understands and consents to the procedure.  The left posterior and lateral thorax was prepped with Betadine in a  sterile fashion, and a sterile drape was applied covering the operative field. A sterile gown and sterile gloves were used for the procedure.  Under sonographic guidance, an Angiocath was inserted into the pleural space and removed over the 3 J. The tract was dilated up to the peel-away sheath. A second incision was made 10 cm anterior to the puncture site. The leading edge of the drainage catheter was advanced from the second incision out the puncture site incision. It was fed through the peel-away sheath. The peel-away sheath was removed. The cuff of the catheter was positioned in the subcutaneous tract adjacent to the second incision. 200 cc clear yellow fluid was aspirated. The puncture incision was closed with a 3-0 Vicryl subcutaneous stitch and a 4-0 Vicryl subcuticular stitch. The second incision was closed with an 0 Prolene pursestring.  FINDINGS: Sonographic and fluoroscopic images demonstrate placement of a left PleurX drain.  COMPLICATIONS: None  IMPRESSION:  Successful left PleurX drain placement.   Electronically Signed   By: Maryclare Bean M.D.   On: 01/12/2014 14:56   Dg Chest Port 1 View  01/11/2014   CLINICAL DATA:  Respiratory distress. Lung carcinoma. COPD. Hypertension.  EXAM: PORTABLE CHEST - 1 VIEW  COMPARISON:  01/07/2014  FINDINGS: Increased size of left subpulmonic pleural effusion seen compared to previous study. Increased atelectasis seen in the left lung base.  Ill-defined pulmonary nodules in the right lower lung are again seen, consistent with pulmonary metastases. Cardiomegaly remains stable. Mediastinal lymphadenopathy in the aortopulmonary window appears stable.  IMPRESSION: Increased left subpulmonic pleural effusion and left basilar atelectasis.  No significant change in mediastinal lymphadenopathy in the AP window and multiple right lung nodules consistent with pulmonary metastases.  Stable cardiomegaly.   Electronically Signed   By: Earle Gell M.D.   On: 01/11/2014 15:24     Scheduled Meds: . albuterol  2.5 mg Nebulization Q6H  . antiseptic oral rinse  15 mL Mouth Rinse BID  . atorvastatin  10 mg Oral Daily  . dexamethasone  4 mg Oral Daily  . donepezil  10 mg Oral QHS  . fentaNYL      . heparin  5,000 Units Subcutaneous 3 times per day  . metoprolol tartrate  12.5 mg Oral BID  . NIFEdipine  30 mg Oral Daily  . OxyCODONE  10 mg Oral Q12H  . pantoprazole  40 mg Oral Daily  . sertraline  100 mg Oral Daily  . sodium chloride  3 mL Intravenous Q12H  . sodium chloride  3 mL Intravenous Q12H   Continuous Infusions:   Principal Problem:   Acute respiratory failure Active Problems:   Pleural effusion   Lung cancer   A-fib    Time spent: 25 minutes. Greater than 50% of this time was spent in direct contact with the patient coordinating care.    [facility] Pager [phone]  If 7PM-7AM, please contact night-coverage at [email or web address], password [facility] 01/13/2014, 3:05 PM  LOS: 2 days

## 2014-01-13 NOTE — Procedures (Signed)
R iliac Bx No comp

## 2014-01-14 NOTE — Progress Notes (Signed)
Pleurex was drained and drained 0 cc. Daughter at bedside is an Therapist, sports. She was educated about the pleurex catheter and feels very comfortable being able to perform this every other day for her mother. Will notify MD at this time. Will continue to monitor patient. Pearline Cables Setzer

## 2014-01-14 NOTE — Evaluation (Signed)
Physical Therapy Evaluation Patient Details Name: Karen Adams MRN: [id number] DOB: [birth year] Today's Date: 01/14/2014   History of Present Illness  Left PleurX cath placed 5/11 for recurrent effusion for presumed carcinoma  Clinical Impression  Pt mobilized in room. Pt and daughter instructed in HEP. No f/u PT at present. Daughter is a Marine scientist, encouraged pt to ambulate as tolerated.    Follow Up Recommendations No PT follow up    Equipment Recommendations       Recommendations for Other Services       Precautions / Restrictions Precautions Precautions: Fall Precaution Comments: on O2      Mobility  Bed Mobility                  Transfers  supervision for sit to stand Ambulated x 20' in room with no assistance.                                  Stairs            Wheelchair Mobility    Modified Rankin (Stroke Patients Only)       Balance Overall balance assessment: Needs assistance Sitting-balance support: No upper extremity supported;Feet supported Sitting balance-Leahy Scale: Good     Standing balance support: No upper extremity supported;During functional activity Standing balance-Leahy Scale: Fair                               Pertinent Vitals/Pain On 2 l, mild dyspnea  With HEP    Home Living Family/patient expects to be discharged to:: Private residence Living Arrangements: Spouse/significant other;Children Available Help at Discharge: Family Type of Home: House       Home Layout: One level Home Equipment: Environmental consultant - 2 wheels;Bedside commode Additional Comments: family to get a tibbench    Prior Function Level of Independence: Needs assistance   Gait / Transfers Assistance Needed: walks around without assist,  ADL's / Homemaking Assistance Needed: has family to assist with ADL's        Hand Dominance        Extremity/Trunk Assessment   Upper Extremity Assessment: Generalized weakness           Lower Extremity Assessment: Generalized weakness         Communication      Cognition Arousal/Alertness: Awake/alert Behavior During Therapy: WFL for tasks assessed/performed Overall Cognitive Status: Within Functional Limits for tasks assessed                      General Comments      Exercises General Exercises - Lower Extremity Ankle Circles/Pumps: AROM;10 reps;Both Quad Sets: AROM;Both;10 reps Gluteal Sets: AROM;Both;10 reps Short Arc Quad: AROM;Both;10 reps Long Arc Quad: AROM;Both;10 reps Heel Slides: AROM;Both;10 reps Hip ABduction/ADduction: AROM;Both;10 reps Hip Flexion/Marching: AROM;Both;10 reps Shoulder Exercises Shoulder Flexion: AROM;Both;10 reps      Assessment/Plan    PT Assessment Patent does not need any further PT services  PT Diagnosis     PT Problem List    PT Treatment Interventions     PT Goals (Current goals can be found in the Care Plan section) Acute Rehab PT Goals PT Goal Formulation: No goals set, d/c therapy    Frequency     Barriers to discharge        Co-evaluation  End of Session   Activity Tolerance: Patient tolerated treatment well Patient left: in chair;with call bell/phone within reach;with family/visitor present Nurse Communication: Mobility status         Time: 0354-6568 PT Time Calculation (min): 18 min   Charges:   PT Evaluation $Initial PT Evaluation Tier I: 1 Procedure PT Treatments $Gait Training: 8-22 mins   PT G Codes:          Claretha Cooper 01/14/2014, 1:16 PM Tresa Endo PT [phone]

## 2014-01-14 NOTE — Discharge Summary (Addendum)
Physician Discharge Summary  Shandora Koogler YQM:[id number] DOB: [birth year] DOA: 01/11/2014  PCP: Truitt Merle, NP  Admit date: 01/11/2014 Discharge date: 01/14/2014  Time spent: Greater than 30 minutes  Recommendations for Outpatient Follow-up:  1. Dr. Modesto Charon, CVTS on 01/20/14 at 1:45 PM, for followup and management of left-sided Pleurx catheter. 2. Dr. Curt Bears, medical oncology on 01/29/14 at 8:45 AM: For followup of biopsy results and further management. 3. Dr. Valda Lamb, PCP in 1 week 4. Continue oxygen via nasal cannula at 2 L per minute continuously. 5. Every other day drainage Pleur-X catheter as per directions  Discharge Diagnoses:  Principal Problem:   Acute respiratory failure Active Problems:   Lung cancer   A-fib   Pleural effusion   Discharge Condition: Improved & Stable  Diet recommendation: Heart healthy diet  Filed Weights   01/11/14 1735  Weight: 52 kg (114 lb 10.2 oz)    History of present illness:  80 year old female patient with history of dementia, HLD, A. fib-not on anticoagulation, HTN, with questionable metastatic lung cancer but unfortunately no tissue diagnosis yet. Patient underwent several procedures including ultrasound guided thoracentesis and the final cytology showed atypical cells. She also had bronchoscopy twice with endobronchial ultrasound and biopsies but the final cytology showed no malignant cells. She recently completed palliative radiation therapy to T1-T3 metastatic bone lesions under the care of Dr. Pablo Ledger. Her recent MRI of the brain showed no evidence for metastatic disease to the brain but the previous PET scan showed hypermetabolic left suprahilar mass consistent with primary bronchogenic carcinoma , bilateral hypermetabolic small pulmonary nodules, hypermetabolic mediastinal nodal metastasis, a single metastasis to the body of the pancreas and widespread skeletal metastasis. All these findings are highly suspicious for  metastatic lung cancer but unfortunately no tissue diagnosis of malignancy is confirmed. She now presented on 01/11/14 with complaints of worsening dyspnea and worsening left-sided pleural effusion. She was admitted for further management.  Hospital Course:   1. Acute on chronic hypoxic respiratory failure: Secondary to lung mass and left sided pleural effusion. Patient underwent thoracentesis and placement of left Pleurx catheter by interventional radiology. Patient has clinically improved. CVTS was consulted and will continue to manage her Pleurx catheter as outpatient. Patient was on home oxygen PTA and will continue same. 2. Recurrent left malignant pleural effusion: Management as above. 3. Stage IV lung cancer: Patient underwent biopsy of right is bone. On day of discharge, pathology results were not back. Discussed with Dr. Curt Bears who advised that patient could be discharged home and he will arrange outpatient followup with pathology results. Continue dexamethasone. Since discharge, pathology results are back and indicate metastatic poorly differentiated carcinoma of lung primary. 4. History of atrial fibrillation: Rate controlled on metoprolol. Not on anticoagulation. 5. Hypertension: Controlled. Continue metoprolol and Procardia 6. History of dementia: Mental status probably at baseline. Continue Aricept. 7. History of HLD: Continue statins. 8. Anemia: Stable. Outpatient followup with oncology  Consultations:  Cardiothoracic surgery  Medical oncology  Interventional radiology  Procedures:  Ultrasound-guided left-sided thoracentesis (drained 200 mL) and placement of a Pleurx catheter on 01/12/14   CT-guided right iliac bone core biopsy on 01/13/14   Discharge Exam:  Complaints:  Dyspnea much improved. Some pain at Pleurx catheter site. Denies any other complaints. As per daughter at bedside, no other acute issues or complaints.  Filed Vitals:   01/13/14 1433 01/13/14  1527 01/13/14 2101 01/14/14 0537  BP: 152/59 120/46 125/50 153/49  Pulse: 91 75 92 86  Temp:  97.4 F (36.3 C) 97.6 F (36.4 C) 98.2 F (36.8 C)  TempSrc:   Oral Oral  Resp: 21 18 18 18   Height:      Weight:      SpO2: 89%  98% 92%    General exam: Moderately built and frail elderly female was seen sitting propped up in bed without distress and appeared comfortable. Respiratory system: Diminished breath sounds in the bases left > right with a few basal crackles. Rest of lung fields seem clear to auscultation. No increased work of breathing. Cardiovascular system: S1 & S2 heard, irregularly irregular. No JVD, murmurs, gallops, clicks or pedal edema. Gastrointestinal system: Abdomen is nondistended, soft and nontender. Normal bowel sounds heard. Central nervous system: Alert and oriented x2. No focal neurological deficits. Extremities: Symmetric 5 x 5 power.  Discharge Instructions      Discharge Orders   Future Appointments Provider Department Dept Phone   01/20/2014 1:45 PM Melrose Nakayama, MD [facility] [phone]   01/29/2014 8:45 AM [facility] [phone]   01/29/2014 9:15 AM Curt Bears, MD [facility] [phone]   Future Orders Complete By Expires   Call MD for:  difficulty breathing, headache or visual disturbances  As directed    Call MD for:  redness, tenderness, or signs of infection (pain, swelling, redness, odor or green/yellow discharge around incision site)  As directed    Call MD for:  severe uncontrolled pain  As directed    Call MD for:  temperature >100.4  As directed    Diet - low sodium heart healthy  As directed    Discharge instructions  As directed    Increase activity slowly  As directed        Medication List         albuterol (2.5 MG/3ML) 0.083% nebulizer solution  Commonly known as:  PROVENTIL  Take 2.5 mg by  nebulization every 3 (three) hours as needed for wheezing or shortness of breath.     atorvastatin 10 MG tablet  Commonly known as:  LIPITOR  Take 1 tablet (10 mg total) by mouth daily.     dexamethasone 4 MG tablet  Commonly known as:  DECADRON  Take 4 mg by mouth daily.     donepezil 10 MG tablet  Commonly known as:  ARICEPT  Take 1 tablet (10 mg total) by mouth at bedtime.     folic acid 1 MG tablet  Commonly known as:  FOLVITE  Take 1 tablet (1 mg total) by mouth daily.     gabapentin 600 MG tablet  Commonly known as:  NEURONTIN  Take 1,200 mg by mouth at bedtime.     hyaluronate sodium Gel  Apply 1 application topically 2 (two) times daily as needed. Apply to affected area after radiation treatments     lidocaine 5 %  Commonly known as:  LIDODERM  Place 1 patch onto the skin daily as needed (Pain. Applied to back). Remove & Discard patch within 12 hours or as directed by MD     LORazepam 0.5 MG tablet  Commonly known as:  ATIVAN  Take 0.5 mg by mouth every 8 (eight) hours as needed for anxiety or sleep.     metoprolol tartrate 25 MG tablet  Commonly known as:  LOPRESSOR  Take 0.5 tablets (12.5 mg total) by mouth 2 (two) times daily.     NIFEdipine 30 MG 24  hr tablet  Commonly known as:  PROCARDIA-XL/ADALAT CC  Take 1 tablet (30 mg total) by mouth daily.     ondansetron 4 MG tablet  Commonly known as:  ZOFRAN  Take 1 tablet (4 mg total) by mouth every 8 (eight) hours as needed for nausea or vomiting.     OxyCODONE 10 mg T12a 12 hr tablet  Commonly known as:  OXYCONTIN  Take 10 mg by mouth every 12 (twelve) hours.     oxycodone 5 MG capsule  Commonly known as:  OXY-IR  Take 10 mg by mouth every 4 (four) hours as needed for pain.     pantoprazole 40 MG tablet  Commonly known as:  PROTONIX  Take 40 mg by mouth daily.     sertraline 100 MG tablet  Commonly known as:  ZOLOFT  Take 1 tablet (100 mg total) by mouth daily.       Follow-up Information    Follow up with HENDRICKSON,STEVEN C, MD. Schedule an appointment as soon as possible for a visit in 1 week. (for follow up & management of left sided pleur-X/chest tube management.)    Specialty:  Cardiothoracic Surgery   Contact information:   [address] [place] [address] [phone]       Schedule an appointment as soon as possible for a visit with Eilleen Kempf., MD. (For follow up of biopsy results and further management of presumed cancer)    Specialty:  Oncology   Contact information:   [place] [address] [phone]       Follow up with Dr. Valda Lamb (Primary MD). Schedule an appointment as soon as possible for a visit in 1 week.       The results of significant diagnostics from this hospitalization (including imaging, microbiology, ancillary and laboratory) are listed below for reference.    Significant Diagnostic Studies: Dg Chest 2 View Within Previous 72 Hours.  Films Obtained On Friday Are Acceptable For Monday And Tuesday Cases  01/07/2014   CLINICAL DATA:  Probable metastatic lung carcinoma. Preoperative chest x-ray prior to bronchoscopy and endobronchial ultrasound.  EXAM: CHEST - 2 VIEW  COMPARISON:  Chest x-ray on 12/03/2013 and PET scan on 11/28/2013.  FINDINGS: There is a large recurrent left pleural effusion which may be partially loculated based on radiographic appearance. Consolidation and atelectasis of the left lower lung is present. A multitude of pulmonary nodules are present bilaterally which all appear slightly larger in size by chest x-ray. There is a tumor mass along the left suprahilar region abutting the mediastinum. No pneumothorax is identified.  IMPRESSION: Large recurrent left pleural effusion which may be partially loculated based on radiographic appearance. Bilateral pulmonary nodules appear larger in size compared to prior chest x-ray. Left suprahilar mass again identified.   Electronically Signed   By:  Aletta Edouard M.D.   On: 01/07/2014 10:52   Mr Jeri Cos UX Contrast  01/06/2014   CLINICAL DATA:  Recent diagnosis lung cancer. Staging for metastatic disease. History of stroke and atrial fibrillation.  EXAM: MRI HEAD WITHOUT AND WITH CONTRAST  TECHNIQUE: Multiplanar, multiecho pulse sequences of the brain and surrounding structures were obtained without and with intravenous contrast.  CONTRAST:  66mL MULTIHANCE GADOBENATE DIMEGLUMINE 529 MG/ML IV SOLN  COMPARISON:  No comparisons  FINDINGS: There is abnormal marrow signal within the clivus predominantly left of midline. There is no acute infarct. There is no evidence of intracranial hemorrhage, midline shift, or extra-axial fluid  collection. Patchy foci of T2 hyperintensity are present in the subcortical and deep cerebral white matter bilaterally, nonspecific but compatible with mild chronic small vessel ischemic disease. There is mild generalized cerebral atrophy. No parenchymal mass or abnormal parenchymal or meningeal enhancement is identified.  Prior bilateral cataract surgery is noted. Mild left maxillary sinus mucosal thickening is present. Mastoid air cells are clear. Major intracranial vascular flow voids are preserved.  IMPRESSION: 1. No evidence of parenchymal brain metastases. 2. Abnormal marrow signal in the clivus, suspicious for osseous metastatic disease. 3. Mild chronic small vessel ischemic disease.   Electronically Signed   By: Logan Bores   On: 01/06/2014 21:06   Ir Lenise Arena W Catheter Placement  01/12/2014   CLINICAL DATA:  Lung cancer.  Left pleural effusion.  EXAM: ABSCESS DRAINAGE; IR ULTRASOUND GUIDANCE  FLUOROSCOPY TIME:  42 seconds.  MEDICATIONS AND MEDICAL HISTORY: Versed One mg, Fentanyl 20 mcg.  As antibiotic prophylaxis, Ancef was ordered pre-procedure and administered intravenously within one hour of incision.  ANESTHESIA/SEDATION: Moderate sedation time: 13 minutes  CONTRAST:  None  PROCEDURE: The procedure, risks,  benefits, and alternatives were explained to the patient. Questions regarding the procedure were encouraged and answered. The patient understands and consents to the procedure.  The left posterior and lateral thorax was prepped with Betadine in a sterile fashion, and a sterile drape was applied covering the operative field. A sterile gown and sterile gloves were used for the procedure.  Under sonographic guidance, an Angiocath was inserted into the pleural space and removed over the 3 J. The tract was dilated up to the peel-away sheath. A second incision was made 10 cm anterior to the puncture site. The leading edge of the drainage catheter was advanced from the second incision out the puncture site incision. It was fed through the peel-away sheath. The peel-away sheath was removed. The cuff of the catheter was positioned in the subcutaneous tract adjacent to the second incision. 200 cc clear yellow fluid was aspirated. The puncture incision was closed with a 3-0 Vicryl subcutaneous stitch and a 4-0 Vicryl subcuticular stitch. The second incision was closed with an 0 Prolene pursestring.  FINDINGS: Sonographic and fluoroscopic images demonstrate placement of a left PleurX drain.  COMPLICATIONS: None  IMPRESSION: Successful left PleurX drain placement.   Electronically Signed   By: Maryclare Bean M.D.   On: 01/12/2014 14:56   Ir US Guide Bx Asp/drain  01/12/2014   CLINICAL DATA:  Lung cancer.  Left pleural effusion.  EXAM: ABSCESS DRAINAGE; IR ULTRASOUND GUIDANCE  FLUOROSCOPY TIME:  42 seconds.  MEDICATIONS AND MEDICAL HISTORY: Versed One mg, Fentanyl 20 mcg.  As antibiotic prophylaxis, Ancef was ordered pre-procedure and administered intravenously within one hour of incision.  ANESTHESIA/SEDATION: Moderate sedation time: 13 minutes  CONTRAST:  None  PROCEDURE: The procedure, risks, benefits, and alternatives were explained to the patient. Questions regarding the procedure were encouraged and answered. The patient  understands and consents to the procedure.  The left posterior and lateral thorax was prepped with Betadine in a sterile fashion, and a sterile drape was applied covering the operative field. A sterile gown and sterile gloves were used for the procedure.  Under sonographic guidance, an Angiocath was inserted into the pleural space and removed over the 3 J. The tract was dilated up to the peel-away sheath. A second incision was made 10 cm anterior to the puncture site. The leading edge of the drainage catheter was advanced from the second incision out  the puncture site incision. It was fed through the peel-away sheath. The peel-away sheath was removed. The cuff of the catheter was positioned in the subcutaneous tract adjacent to the second incision. 200 cc clear yellow fluid was aspirated. The puncture incision was closed with a 3-0 Vicryl subcutaneous stitch and a 4-0 Vicryl subcuticular stitch. The second incision was closed with an 0 Prolene pursestring.  FINDINGS: Sonographic and fluoroscopic images demonstrate placement of a left PleurX drain.  COMPLICATIONS: None  IMPRESSION: Successful left PleurX drain placement.   Electronically Signed   By: Maryclare Bean M.D.   On: 01/12/2014 14:56   Ct Biopsy  01/13/2014   CLINICAL DATA:  Metastatic cancer.  Bone lesions.  EXAM: CT-GUIDED BIOPSY OF A RIGHT ILIAC BONE LESION.  MEDICATIONS AND MEDICAL HISTORY: Versed 0 mg, Fentanyl 50 mcg.  Additional Medications: None.  ANESTHESIA/SEDATION: Moderate sedation time: 10 minutes  PROCEDURE: The procedure, risks, benefits, and alternatives were explained to the patient. Questions regarding the procedure were encouraged and answered. The patient understands and consents to the procedure.  The back was prepped with Betadine in a sterile fashion, and a sterile drape was applied covering the operative field. A sterile gown and sterile gloves were used for the procedure.  Under CT guidance, a(n) 17 gauge guide needle was advanced into  the right iliac bone lesion. Subsequently four 18 gauge core biopsies were obtained. The guide needle was removed. Final imaging was performed.  Patient tolerated the procedure well without complication. Vital sign monitoring by nursing staff during the procedure will continue as patient is in the special procedures unit for post procedure observation.  FINDINGS: The images document guide needle placement within the right iliac bone lesion. Post biopsy images demonstrate no hemorrhage.  IMPRESSION: Successful CT-guided right iliac bone core biopsy.   Electronically Signed   By: Maryclare Bean M.D.   On: 01/13/2014 15:48   Dg Chest Port 1 View  01/11/2014   CLINICAL DATA:  Respiratory distress. Lung carcinoma. COPD. Hypertension.  EXAM: PORTABLE CHEST - 1 VIEW  COMPARISON:  01/07/2014  FINDINGS: Increased size of left subpulmonic pleural effusion seen compared to previous study. Increased atelectasis seen in the left lung base.  Ill-defined pulmonary nodules in the right lower lung are again seen, consistent with pulmonary metastases. Cardiomegaly remains stable. Mediastinal lymphadenopathy in the aortopulmonary window appears stable.  IMPRESSION: Increased left subpulmonic pleural effusion and left basilar atelectasis.  No significant change in mediastinal lymphadenopathy in the AP window and multiple right lung nodules consistent with pulmonary metastases.  Stable cardiomegaly.   Electronically Signed   By: Earle Gell M.D.   On: 01/11/2014 15:24   Dg Chest Port 1 View  01/07/2014   CLINICAL DATA:  s/p bronchoscopy and thoracentesis  EXAM: PORTABLE CHEST - 1 VIEW  COMPARISON:  DG CHEST 2 VIEW dated 01/07/2014  FINDINGS: There is no evidence of a pneumothorax. A mild amount of residual density left lung base. Diffuse thickening of the interstitial markings and diffuse bilateral pulmonary opacities are appreciated. There are areas of peribronchial cuffing. Cardiac silhouette within the upper limits of normal. No acute  osseous abnormalities.  IMPRESSION: Residual small left effusion. Atelectasis versus infiltrate left lung base.  Diffuse bilateral pulmonary opacities interstitial pneumonitis versus pulmonary edema  Atelectasis right lung base.   Electronically Signed   By: Margaree Mackintosh M.D.   On: 01/07/2014 18:27    Microbiology: No results found for this or any previous visit (from the past 240 hour(s)).  Labs: Basic Metabolic Panel:  Recent Labs Lab 01/11/14 1520 01/12/14 0410  NA 141 140  K 3.8 5.1  CL 96 96  CO2 36* 36*  GLUCOSE 117* 135*  BUN 16 16  CREATININE 0.52 0.54  CALCIUM 9.2 9.1   Liver Function Tests: No results found for this basename: AST, ALT, ALKPHOS, BILITOT, PROT, ALBUMIN,  in the last 168 hours No results found for this basename: LIPASE, AMYLASE,  in the last 168 hours No results found for this basename: AMMONIA,  in the last 168 hours CBC:  Recent Labs Lab 01/11/14 1520 01/12/14 0410  WBC 12.5* 11.1*  NEUTROABS 9.6*  --   HGB 12.2 11.7*  HCT 40.1 38.0  MCV 91.8 91.6  PLT 292 297   Cardiac Enzymes: No results found for this basename: CKTOTAL, CKMB, CKMBINDEX, TROPONINI,  in the last 168 hours BNP: BNP (last 3 results) No results found for this basename: PROBNP,  in the last 8760 hours CBG: No results found for this basename: GLUCAP,  in the last 168 hours  Additional labs: 1. Pleural fluid 01/15/14: No AFB after 6 weeks culture. Negative bacterial culture. 2. Right iliac bone biopsy, pathology results: Diagnosis 3. Bone, biopsy, right iliac - METASTATIC POORLY DIFFERENTIATED CARCINOMA. PLEASE SEE COMMENT. Pathologist comment: Given the radiographic findings, the overall findings are mostly consistent with metastatic poorly differentiated carcinoma of lung primary. The patchy positivity of this tumor for TTF-1 and Napsin-A is suggestive of the presence of adenocarcinoma component. (HCL:kh 01-15-14)   Signed:  Modena Jansky, MD, FACP, FHM. [facility] Pager [phone]  If 7PM-7AM, please contact night-coverage [email or web address] Password [email or web address] 01/14/2014, 3:00 PM

## 2014-01-15 LAB — AFB CULTURE WITH SMEAR (NOT AT ARMC): Acid Fast Smear: NONE SEEN

## 2014-01-20 ENCOUNTER — Ambulatory Visit
Admission: RE | Admit: 2014-01-20 | Discharge: 2014-01-20 | Disposition: A | Discharge status: Home / Self Care | Payer: Medicare Other | Source: Ambulatory Visit | Attending: Thoracic Surgery (Cardiothoracic Vascular Surgery) | Admitting: Thoracic Surgery (Cardiothoracic Vascular Surgery)

## 2014-01-20 ENCOUNTER — Ambulatory Visit (INDEPENDENT_AMBULATORY_CARE_PROVIDER_SITE_OTHER): Payer: Medicare Other | Admitting: Thoracic Surgery (Cardiothoracic Vascular Surgery)

## 2014-01-20 ENCOUNTER — Encounter: Payer: Self-pay | Admitting: Thoracic Surgery (Cardiothoracic Vascular Surgery)

## 2014-01-20 VITALS — BP 120/70 | HR 102 | Resp 18 | Ht 60.0 in

## 2014-01-20 DIAGNOSIS — C3490 Malignant neoplasm of unspecified part of unspecified bronchus or lung: Secondary | ICD-10-CM

## 2014-01-20 DIAGNOSIS — Z9889 Other specified postprocedural states: Secondary | ICD-10-CM

## 2014-01-20 DIAGNOSIS — J90 Pleural effusion, not elsewhere classified: Secondary | ICD-10-CM

## 2014-01-20 DIAGNOSIS — C801 Malignant (primary) neoplasm, unspecified: Secondary | ICD-10-CM

## 2014-01-20 MED ORDER — FUROSEMIDE 20 MG PO TABS
20.0000 mg | ORAL_TABLET | Freq: Every day | ORAL | Status: AC
Start: 2014-01-20 — End: ?

## 2014-01-20 NOTE — Progress Notes (Signed)
HPI:  Karen Adams returns today for followup regarding her Pleurx catheter.  She is an 80 year old woman who recently been diagnosed with stage IV lung cancer including bone metastases. She had a thoracentesis for a large left pleural effusion. She had a recurrence within a few days and Pleurx catheter placed last week. She was discharged from the hospital on Thursday. She has been drained twice since then. First on the mid been technical issues but there was no drainage. Drainage this morning resulted in a liter of fluid being evacuated.  Says she may be breathing a little better since it was drained, but is not sure. She did not have any significant pain with drainage.  Past Medical History  Diagnosis Date  . COPD (chronic obstructive pulmonary disease)   . A-fib   . Stroke     memory loss  . Dysrhythmia     A-Fibrillation  . Shortness of breath   . Arthritis   . Nausea   . Cancer     lung,pancreas, bone-new diagnosis  . Macular degeneration   . Hypertension     no longer being treated for this  . Aortic aneurysm     Followed by Dr. Servando Snare, very small (per daughter)  . Asthma   . On home oxygen therapy     2 L all the time  . Depression   . Complication of anesthesia   . PONV (postoperative nausea and vomiting)     "a little"  . History of shingles      Current Outpatient Prescriptions  Medication Sig Dispense Refill  . albuterol (PROVENTIL) (2.5 MG/3ML) 0.083% nebulizer solution Take 2.5 mg by nebulization every 3 (three) hours as needed for wheezing or shortness of breath.      Marland Kitchen atorvastatin (LIPITOR) 10 MG tablet Take 1 tablet (10 mg total) by mouth daily.  30 tablet  0  . dexamethasone (DECADRON) 4 MG tablet Take 4 mg by mouth daily.       Marland Kitchen donepezil (ARICEPT) 10 MG tablet Take 1 tablet (10 mg total) by mouth at bedtime.  30 tablet  3  . folic acid (FOLVITE) 1 MG tablet Take 1 tablet (1 mg total) by mouth daily.  30 tablet  3  . gabapentin (NEURONTIN) 600 MG  tablet Take 1,200 mg by mouth at bedtime.      . hyaluronate sodium (RADIAPLEXRX) GEL Apply 1 application topically 2 (two) times daily as needed. Apply to affected area after radiation treatments      . lidocaine (LIDODERM) 5 % Place 1 patch onto the skin daily as needed (Pain. Applied to back). Remove & Discard patch within 12 hours or as directed by MD      . LORazepam (ATIVAN) 0.5 MG tablet Take 0.5 mg by mouth every 8 (eight) hours as needed for anxiety or sleep.       . metoprolol tartrate (LOPRESSOR) 25 MG tablet Take 0.5 tablets (12.5 mg total) by mouth 2 (two) times daily.  60 tablet  3  . NIFEdipine (PROCARDIA-XL/ADALAT CC) 30 MG 24 hr tablet Take 1 tablet (30 mg total) by mouth daily.  30 tablet  3  . ondansetron (ZOFRAN) 4 MG tablet Take 1 tablet (4 mg total) by mouth every 8 (eight) hours as needed for nausea or vomiting.  30 tablet  0  . oxycodone (OXY-IR) 5 MG capsule Take 10 mg by mouth every 4 (four) hours as needed for pain.       . OxyCODONE (  OXYCONTIN) 10 mg T12A 12 hr tablet Take 10 mg by mouth every 12 (twelve) hours.      . pantoprazole (PROTONIX) 40 MG tablet Take 40 mg by mouth daily.       . sertraline (ZOLOFT) 100 MG tablet Take 1 tablet (100 mg total) by mouth daily.  30 tablet  0  . furosemide (LASIX) 20 MG tablet Take 1 tablet (20 mg total) by mouth daily.  30 tablet  3  . prochlorperazine (COMPAZINE) 10 MG tablet       . traMADol (ULTRAM) 50 MG tablet        No current facility-administered medications for this visit.    Physical Exam BP 120/70  Pulse 102  Resp 18  Ht 5' (1.524 m)  SpO2 90% Elderly, frail-appearing 80 year old woman in no acute distress Nasal cannula oxygen in place Lungs diminished but equal bilaterally  Diagnostic Tests: Chest x-ray 01/20/2014 CHEST 2 VIEW  COMPARISON: Portable chest x-ray of 01/11/2014 and two-view chest  x-ray of 01/07/2014  FINDINGS:  There has been reduction in the volume of the left pleural effusion.  Vague  opacity overlies the medial left upper lung field representing  the neoplasm. Multiple lung nodules are scattered throughout both  lungs consistent with diffuse lung metastasis. Mild cardiomegaly is  stable. A pleural-based opacity is noted posteriorly on the lateral  view most likely representing loculated effusion on the left. A  left-sided PleurX catheter is present.  IMPRESSION:  1. Reduction in the volume of the left effusion with a left PleurX  catheter present.  2. No change in abnormal opacity along the medial left upper hemi  thorax consistent with primary lung carcinoma with diffuse lung  metastases.  3. Proper to loculated left pleural effusion posteriorly on the  lateral view.  Electronically Signed  By: Ivar Drape M.D.  On: 01/20/2014 13:50  Impression: 80 year old woman with stage IV lung cancer. She had a Pleurx catheter placed for recurring effusion. She drained about a liter this morning and there is a minimal effusion on her chest x-ray. I would recommend continuing to drain on every other day basis.  Karen Adams and her son were not aware of the pathology results. I discussed those results with them. This was a very poorly differentiated tumor. Immunostains were not definitive, but based on the clinical scenario the final diagnosis was metastatic lung cancer.  She has an appointment to see Dr. Julien Nordmann to discuss possible chemotherapy next week.  Plan: Drain Pleurx catheter every other day Return in 3 weeks with a PA and lateral chest x-ray

## 2014-01-29 ENCOUNTER — Ambulatory Visit: Payer: Medicare Other | Admitting: Internal Medicine

## 2014-01-29 ENCOUNTER — Other Ambulatory Visit: Payer: Medicare Other

## 2014-01-29 ENCOUNTER — Other Ambulatory Visit: Payer: Self-pay | Admitting: *Deleted

## 2014-01-29 DIAGNOSIS — C3490 Malignant neoplasm of unspecified part of unspecified bronchus or lung: Secondary | ICD-10-CM

## 2014-02-10 ENCOUNTER — Ambulatory Visit: Payer: Medicare Other | Admitting: Thoracic Surgery (Cardiothoracic Vascular Surgery)

## 2014-02-19 ENCOUNTER — Telehealth: Payer: Self-pay | Admitting: Medical Oncology

## 2014-02-19 NOTE — Telephone Encounter (Signed)
Pt died 02/17/14

## 2014-03-04 DEATH — deceased

## 2015-11-25 IMAGING — PT NM PET TUM IMG INITIAL (PI) SKULL BASE T - THIGH
1 of 8 series · 1 of 25 positions shown · non-contrast
Comparison: CT ANGIO CHEST W/CM &/OR WO/CM dated 11/20/2013; CT
ANGIO CHEST-ABD-PELV dated 09/15/2013

CLINICAL DATA: Initial treatment strategy for lung mass.

EXAM:
NUCLEAR MEDICINE PET SKULL BASE TO THIGH
TECHNIQUE: 6.5 mCi F-18 FDG was injected intravenously. Full-ring PET imaging
was performed from the skull base to thigh after the radiotracer. CT
data was obtained and used for attenuation correction and anatomic
localization.
FASTING BLOOD GLUCOSE:  Value: 118 mg/dl

[Series 4: ct sk_thigh 5.0 b31f · axial · 5.0mm · 0.94mm/px · 1 of 208 slices shown]
[im 208/208  brain]
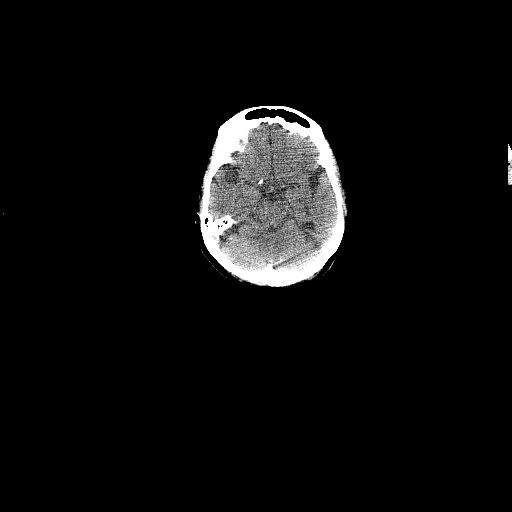

[1 of 25 positions shown; findings below may reference images not displayed]

FINDINGS: NECK

No hypermetabolic lymph nodes in the neck.

CHEST

Enlarging left upper lobe suprahilar mass is intensely
hypermetabolic with SUV max equal 9.9. The mass measures
approximately 4.8 by 3.3 cm. There are bilateral small 1 cm
pulmonary nodules which are hypermetabolic consistent with bilateral
pulmonary metastasis. There are prevascular and paratracheal lymph
nodes which are hypermetabolic consistent with mediastinal nodal
metastasis.

There is a small left effusion which is increased compared to prior.

ABDOMEN/PELVIS

There is a focus of discrete hypermetabolic activity within the body
of the pancreas with SUV max = 9.4. Lesion is difficult to define on
the non contrast CT but is present on image 114. No liver metastasis
evident. No adrenal metastasis.

SKELETON

There is widespread skeletal metastasis. There is a lytic lesion in
the body of the T2 vertebral body with intense metabolic activity
(SUV max 11.2). Additional lesion seen in the T5 vertebral body.
There are hypermetabolic lesions within the the pelvis and sacrum.
For example lesion in the posterior right iliac bone measuring
cm with SUV max 15.5. Additional smaller metastatic lesions
scattered throughout the spine and sternum.
IMPRESSION: 1. Hypermetabolic left suprahilar mass most consistent with primary
bronchogenic carcinoma.
2. Bilateral hypermetabolic small pulmonary nodules consistent with
pulmonary metastasis.
3. Hypermetabolic mediastinal nodal metastasis.
4. Single metastasis to the body of the pancreas.
5. Widespread skeletal metastasis which are hypermetabolic.

## 2015-11-30 IMAGING — CR DG CHEST 1V
1 series · 1 of 1 positions shown · non-contrast
Comparison: DG CHEST 2V dated 09/03/2011;

CLINICAL DATA: Evaluate for pneumothorax post left-sided
thoracentesis

EXAM:
CHEST - 1 VIEW

[w chest pa]
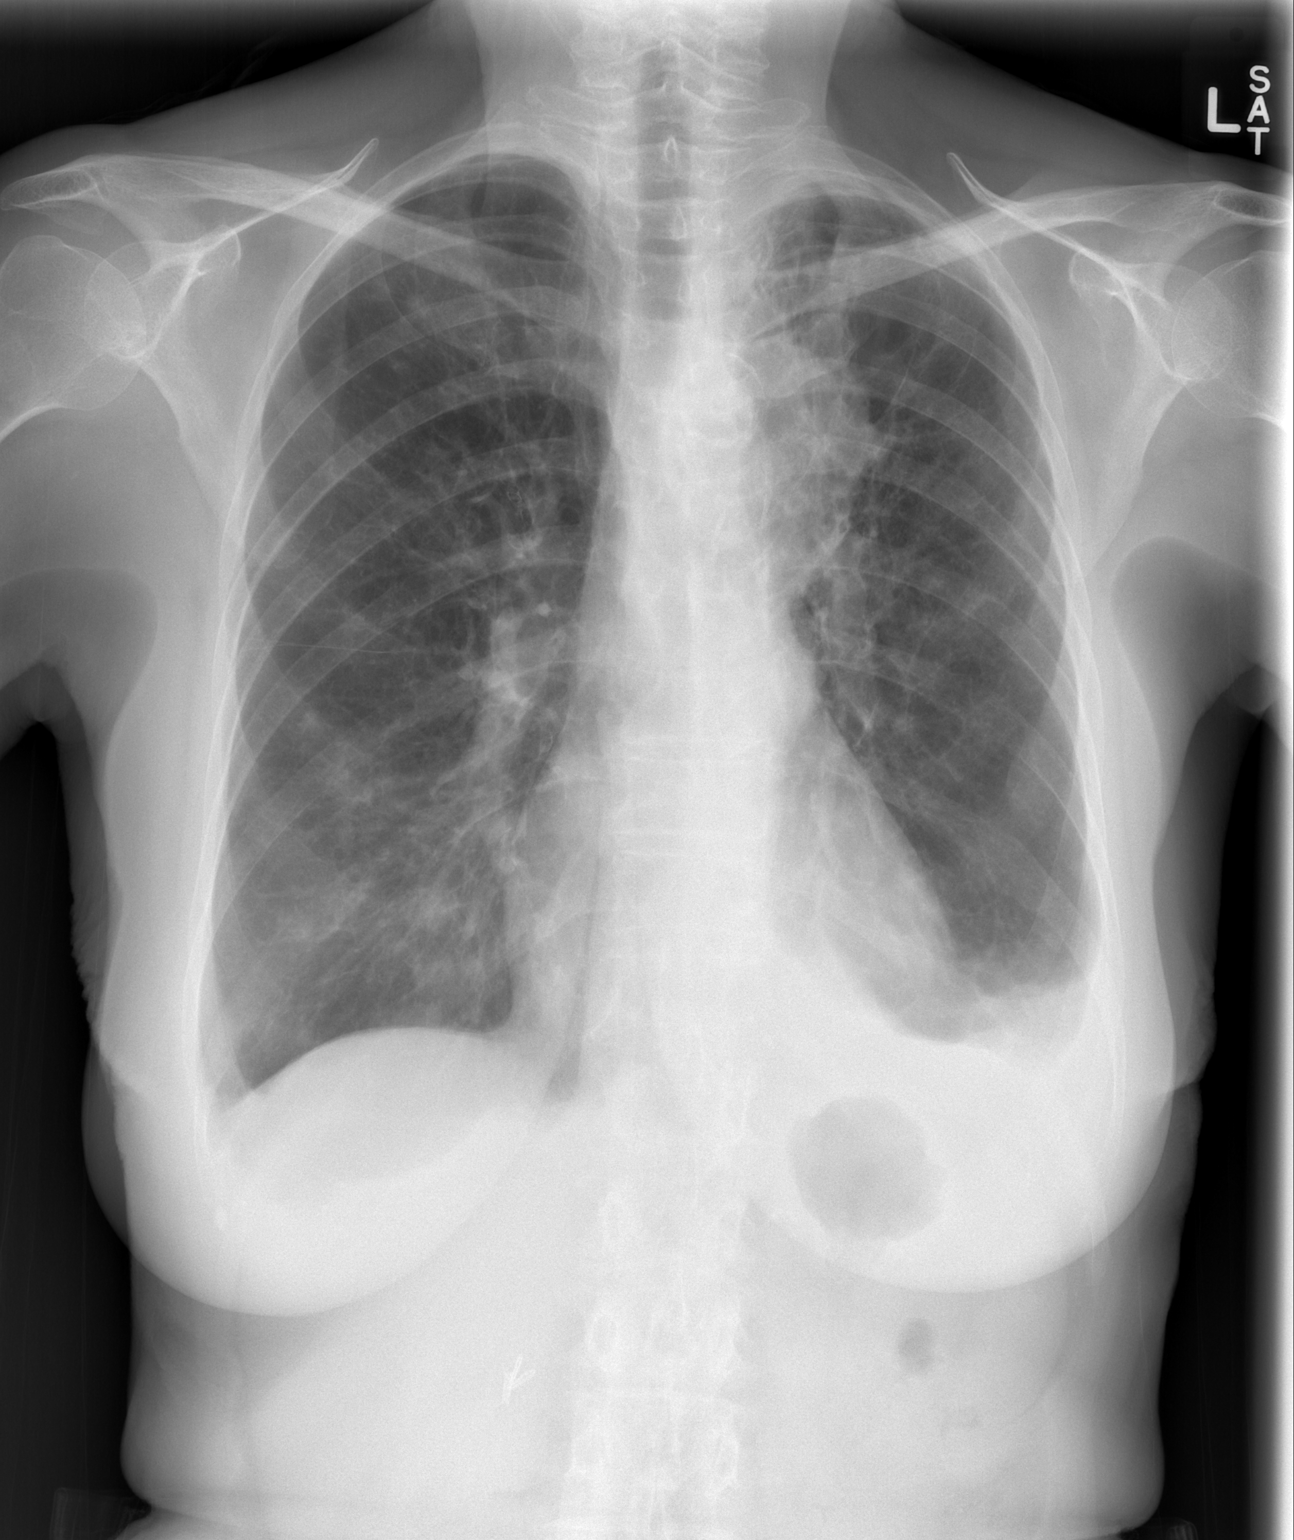

[1 of 1 positions shown; findings below may reference images not displayed]

US THORACENTESIS ASP
PLEURAL SPACE W/IMG GUIDE dated 12/03/2013; CT ANGIO CHEST W/CM &/OR
WO/CM dated 11/20/2013; NM PET IMAGE INITIAL (PI) SKULL BASE TO THIGH
dated 11/28/2013
FINDINGS: Grossly unchanged cardiac silhouette and mediastinal contours with
atherosclerotic plaque within the thoracic aorta. There is
persistent obscuration of the superior aspect of the left hilum
secondary to known left suprahilar mass. Interval decrease in
residual small left-sided effusion post thoracentesis. No
pneumothorax. Unchanged trace right-sided pleural effusion. The
lungs remain hyperexpanded with diffuse nodular thickening of the
pulmonary interstitium. Scattered bilateral discrete nodules within
the bilateral lungs are also unchanged, right greater than left.
Unchanged bones. Post cholecystectomy.
IMPRESSION: 1. Interval decrease in residual small left-sided effusion post
thoracentesis. No pneumothorax.
2. Grossly unchanged findings of dominant left suprahilar mass and
metastatic disease involving the bilateral lungs as demonstrated on
recently performed PET-CT.
